# Patient Record
Sex: Male | Born: 1955 | Race: White | Hispanic: No | Marital: Married | Smoking: Never smoker
Health system: Southern US, Community
[De-identification: ages and names within clinical notes are randomized; demographics above are authoritative.]

## PROBLEM LIST (undated history)

## (undated) DIAGNOSIS — E785 Hyperlipidemia, unspecified: Secondary | ICD-10-CM

## (undated) DIAGNOSIS — F419 Anxiety disorder, unspecified: Secondary | ICD-10-CM

## (undated) DIAGNOSIS — F32A Depression, unspecified: Secondary | ICD-10-CM

## (undated) DIAGNOSIS — E119 Type 2 diabetes mellitus without complications: Secondary | ICD-10-CM

## (undated) DIAGNOSIS — H4920 Sixth [abducent] nerve palsy, unspecified eye: Secondary | ICD-10-CM

## (undated) DIAGNOSIS — F410 Panic disorder [episodic paroxysmal anxiety] without agoraphobia: Secondary | ICD-10-CM

## (undated) DIAGNOSIS — K219 Gastro-esophageal reflux disease without esophagitis: Secondary | ICD-10-CM

## (undated) DIAGNOSIS — N200 Calculus of kidney: Secondary | ICD-10-CM

## (undated) DIAGNOSIS — F329 Major depressive disorder, single episode, unspecified: Secondary | ICD-10-CM

## (undated) DIAGNOSIS — J302 Other seasonal allergic rhinitis: Secondary | ICD-10-CM

## (undated) DIAGNOSIS — M4810 Ankylosing hyperostosis [Forestier], site unspecified: Secondary | ICD-10-CM

## (undated) DIAGNOSIS — K5792 Diverticulitis of intestine, part unspecified, without perforation or abscess without bleeding: Secondary | ICD-10-CM

## (undated) HISTORY — DX: Type 2 diabetes mellitus without complications: E11.9

## (undated) HISTORY — DX: Panic disorder (episodic paroxysmal anxiety): F41.0

## (undated) HISTORY — DX: Sixth (abducent) nerve palsy, unspecified eye: H49.20

## (undated) HISTORY — DX: Hyperlipidemia, unspecified: E78.5

## (undated) HISTORY — DX: Anxiety disorder, unspecified: F41.9

## (undated) HISTORY — DX: Major depressive disorder, single episode, unspecified: F32.9

## (undated) HISTORY — DX: Depression, unspecified: F32.A

## (undated) HISTORY — DX: Diverticulitis of intestine, part unspecified, without perforation or abscess without bleeding: K57.92

## (undated) HISTORY — DX: Ankylosing hyperostosis (forestier), site unspecified: M48.10

## (undated) HISTORY — DX: Other seasonal allergic rhinitis: J30.2

## (undated) HISTORY — DX: Gastro-esophageal reflux disease without esophagitis: K21.9

---

## 1999-09-17 ENCOUNTER — Encounter: Payer: Self-pay | Admitting: Internal Medicine

## 1999-09-17 ENCOUNTER — Encounter: Admission: RE | Admit: 1999-09-17 | Discharge: 1999-09-17 | Payer: Self-pay | Admitting: Internal Medicine

## 1999-10-12 ENCOUNTER — Encounter: Admission: RE | Admit: 1999-10-12 | Discharge: 1999-10-12 | Payer: Self-pay | Admitting: Rheumatology

## 1999-10-12 ENCOUNTER — Encounter: Payer: Self-pay | Admitting: Rheumatology

## 1999-12-08 ENCOUNTER — Ambulatory Visit (HOSPITAL_COMMUNITY): Admission: RE | Admit: 1999-12-08 | Discharge: 1999-12-08 | Payer: Self-pay | Admitting: Gastroenterology

## 2001-06-13 ENCOUNTER — Other Ambulatory Visit (HOSPITAL_COMMUNITY): Admission: RE | Admit: 2001-06-13 | Discharge: 2001-06-30 | Payer: Self-pay | Admitting: *Deleted

## 2001-07-24 ENCOUNTER — Encounter: Admission: RE | Admit: 2001-07-24 | Discharge: 2001-07-24 | Payer: Self-pay | Admitting: *Deleted

## 2001-11-22 ENCOUNTER — Encounter: Admission: RE | Admit: 2001-11-22 | Discharge: 2001-11-22 | Payer: Self-pay | Admitting: *Deleted

## 2002-01-24 ENCOUNTER — Encounter: Admission: RE | Admit: 2002-01-24 | Discharge: 2002-01-24 | Payer: Self-pay | Admitting: *Deleted

## 2002-04-04 ENCOUNTER — Encounter: Admission: RE | Admit: 2002-04-04 | Discharge: 2002-04-04 | Payer: Self-pay | Admitting: *Deleted

## 2002-05-02 ENCOUNTER — Encounter: Admission: RE | Admit: 2002-05-02 | Discharge: 2002-05-02 | Payer: Self-pay | Admitting: *Deleted

## 2002-06-13 ENCOUNTER — Encounter: Admission: RE | Admit: 2002-06-13 | Discharge: 2002-06-13 | Payer: Self-pay | Admitting: *Deleted

## 2002-08-13 ENCOUNTER — Encounter: Admission: RE | Admit: 2002-08-13 | Discharge: 2002-08-13 | Payer: Self-pay | Admitting: *Deleted

## 2002-12-10 ENCOUNTER — Encounter: Admission: RE | Admit: 2002-12-10 | Discharge: 2002-12-10 | Payer: Self-pay | Admitting: *Deleted

## 2003-06-27 ENCOUNTER — Encounter: Admission: RE | Admit: 2003-06-27 | Discharge: 2003-06-27 | Payer: Self-pay | Admitting: Psychiatry

## 2003-12-11 ENCOUNTER — Ambulatory Visit (HOSPITAL_COMMUNITY): Payer: Self-pay | Admitting: Professional Counselor

## 2004-01-20 ENCOUNTER — Ambulatory Visit (HOSPITAL_COMMUNITY): Payer: Self-pay | Admitting: Psychiatry

## 2004-02-17 ENCOUNTER — Ambulatory Visit (HOSPITAL_COMMUNITY): Payer: Self-pay | Admitting: Professional Counselor

## 2004-03-01 ENCOUNTER — Emergency Department (HOSPITAL_COMMUNITY): Admission: EM | Admit: 2004-03-01 | Discharge: 2004-03-02 | Payer: Self-pay | Admitting: Emergency Medicine

## 2004-03-10 ENCOUNTER — Ambulatory Visit: Payer: Self-pay | Admitting: Internal Medicine

## 2004-03-31 ENCOUNTER — Ambulatory Visit: Payer: Self-pay | Admitting: Internal Medicine

## 2004-04-20 ENCOUNTER — Ambulatory Visit (HOSPITAL_COMMUNITY): Payer: Self-pay | Admitting: Psychiatry

## 2004-05-27 ENCOUNTER — Ambulatory Visit: Payer: Self-pay | Admitting: Internal Medicine

## 2004-06-02 ENCOUNTER — Ambulatory Visit: Payer: Self-pay | Admitting: Internal Medicine

## 2004-06-15 ENCOUNTER — Ambulatory Visit (HOSPITAL_COMMUNITY): Payer: Self-pay | Admitting: Psychiatry

## 2004-09-21 ENCOUNTER — Ambulatory Visit (HOSPITAL_COMMUNITY): Payer: Self-pay | Admitting: Psychiatry

## 2004-10-12 ENCOUNTER — Ambulatory Visit: Payer: Self-pay | Admitting: Internal Medicine

## 2004-10-19 ENCOUNTER — Ambulatory Visit: Payer: Self-pay | Admitting: Internal Medicine

## 2004-10-19 ENCOUNTER — Encounter: Admission: RE | Admit: 2004-10-19 | Discharge: 2004-10-19 | Payer: Self-pay | Admitting: Internal Medicine

## 2004-11-02 ENCOUNTER — Ambulatory Visit (HOSPITAL_COMMUNITY): Payer: Self-pay | Admitting: Psychiatry

## 2005-02-01 ENCOUNTER — Ambulatory Visit (HOSPITAL_COMMUNITY): Payer: Self-pay | Admitting: Psychiatry

## 2005-02-08 LAB — HM COLONOSCOPY

## 2005-05-03 ENCOUNTER — Ambulatory Visit (HOSPITAL_COMMUNITY): Payer: Self-pay | Admitting: Psychiatry

## 2005-08-11 ENCOUNTER — Ambulatory Visit (HOSPITAL_COMMUNITY): Payer: Self-pay | Admitting: Psychiatry

## 2005-11-08 ENCOUNTER — Ambulatory Visit (HOSPITAL_COMMUNITY): Payer: Self-pay | Admitting: Psychiatry

## 2006-03-07 ENCOUNTER — Ambulatory Visit (HOSPITAL_COMMUNITY): Payer: Self-pay | Admitting: Psychiatry

## 2006-04-18 ENCOUNTER — Emergency Department (HOSPITAL_COMMUNITY): Admission: EM | Admit: 2006-04-18 | Discharge: 2006-04-18 | Payer: Self-pay | Admitting: Emergency Medicine

## 2006-05-17 ENCOUNTER — Ambulatory Visit: Payer: Self-pay

## 2006-05-18 ENCOUNTER — Ambulatory Visit (HOSPITAL_COMMUNITY): Payer: Self-pay | Admitting: Psychiatry

## 2006-08-03 ENCOUNTER — Ambulatory Visit (HOSPITAL_COMMUNITY): Payer: Self-pay | Admitting: Psychiatry

## 2006-11-17 DIAGNOSIS — F329 Major depressive disorder, single episode, unspecified: Secondary | ICD-10-CM

## 2006-11-17 DIAGNOSIS — M109 Gout, unspecified: Secondary | ICD-10-CM

## 2006-11-17 DIAGNOSIS — F411 Generalized anxiety disorder: Secondary | ICD-10-CM | POA: Insufficient documentation

## 2006-12-05 ENCOUNTER — Ambulatory Visit (HOSPITAL_COMMUNITY): Payer: Self-pay | Admitting: Psychiatry

## 2007-03-30 DIAGNOSIS — N200 Calculus of kidney: Secondary | ICD-10-CM

## 2007-03-30 HISTORY — DX: Calculus of kidney: N20.0

## 2007-04-17 ENCOUNTER — Ambulatory Visit (HOSPITAL_COMMUNITY): Payer: Self-pay | Admitting: Psychiatry

## 2007-08-07 ENCOUNTER — Ambulatory Visit (HOSPITAL_COMMUNITY): Payer: Self-pay | Admitting: Psychiatry

## 2007-11-08 ENCOUNTER — Ambulatory Visit: Payer: Self-pay | Admitting: Internal Medicine

## 2007-11-08 LAB — CONVERTED CEMR LAB
ALT: 37 units/L (ref 0–53)
AST: 25 units/L (ref 0–37)
Alkaline Phosphatase: 93 units/L (ref 39–117)
CO2: 32 meq/L (ref 19–32)
Chloride: 101 meq/L (ref 96–112)
Creatinine, Ser: 0.9 mg/dL (ref 0.4–1.5)
GFR calc Af Amer: 114 mL/min
GFR calc non Af Amer: 94 mL/min
HDL: 29.3 mg/dL — ABNORMAL LOW (ref >39.0)
Hemoglobin: 16.2 g/dL (ref 13.0–17.0)
Ketones, ur: NEGATIVE mg/dL
Leukocytes, UA: NEGATIVE
Lymphocytes Relative: 24.1 % (ref 12.0–46.0)
Neutro Abs: 5.3 10*3/uL (ref 1.4–7.7)
Neutrophils Relative %: 66.9 % (ref 43.0–77.0)
Nitrite: NEGATIVE
PSA: 2.12 ng/mL (ref 0.10–4.00)
Potassium: 4.4 meq/L (ref 3.5–5.1)
RBC: 5.89 M/uL — ABNORMAL HIGH (ref 4.22–5.81)
TSH: 1.7 microintl units/mL (ref 0.35–5.50)
Testosterone: 211.08 ng/dL — ABNORMAL LOW (ref 350.00–890)
Total Protein: 7.3 g/dL (ref 6.0–8.3)
Triglycerides: 151 mg/dL — ABNORMAL HIGH (ref 0–149)
Urine Glucose: NEGATIVE mg/dL
Urobilinogen, UA: 0.2 (ref 0.0–1.0)
WBC: 8 10*3/uL (ref 4.5–10.5)

## 2007-11-14 ENCOUNTER — Ambulatory Visit: Payer: Self-pay | Admitting: Internal Medicine

## 2007-11-14 DIAGNOSIS — E785 Hyperlipidemia, unspecified: Secondary | ICD-10-CM | POA: Insufficient documentation

## 2007-11-14 DIAGNOSIS — E291 Testicular hypofunction: Secondary | ICD-10-CM

## 2007-11-14 DIAGNOSIS — M810 Age-related osteoporosis without current pathological fracture: Secondary | ICD-10-CM | POA: Insufficient documentation

## 2007-12-06 ENCOUNTER — Ambulatory Visit (HOSPITAL_COMMUNITY): Payer: Self-pay | Admitting: Psychiatry

## 2008-04-18 ENCOUNTER — Ambulatory Visit: Payer: Self-pay | Admitting: Internal Medicine

## 2008-04-18 LAB — CONVERTED CEMR LAB
Albumin: 4 g/dL (ref 3.5–5.2)
HDL: 30.4 mg/dL — ABNORMAL LOW (ref >39.0)
Total Bilirubin: 1.1 mg/dL (ref 0.3–1.2)
Total CHOL/HDL Ratio: 6.2

## 2008-04-24 ENCOUNTER — Ambulatory Visit: Payer: Self-pay | Admitting: Internal Medicine

## 2008-05-29 ENCOUNTER — Ambulatory Visit (HOSPITAL_COMMUNITY): Payer: Self-pay | Admitting: Psychiatry

## 2008-07-16 ENCOUNTER — Ambulatory Visit: Payer: Self-pay | Admitting: Internal Medicine

## 2008-07-16 LAB — CONVERTED CEMR LAB
AST: 21 units/L (ref 0–37)
Alkaline Phosphatase: 85 units/L (ref 39–117)
Cholesterol: 188 mg/dL (ref 0–200)
Total Bilirubin: 0.9 mg/dL (ref 0.3–1.2)
Total CHOL/HDL Ratio: 9
Total Protein: 7.1 g/dL (ref 6.0–8.3)

## 2008-07-23 ENCOUNTER — Ambulatory Visit: Payer: Self-pay | Admitting: Internal Medicine

## 2008-07-23 LAB — CONVERTED CEMR LAB
Cholesterol, target level: 200 mg/dL
LDL Goal: 130 mg/dL

## 2008-10-22 ENCOUNTER — Ambulatory Visit: Payer: Self-pay | Admitting: Internal Medicine

## 2008-11-04 LAB — CONVERTED CEMR LAB
Testosterone: 174.39 ng/dL — ABNORMAL LOW (ref 350.00–890.00)
Triglycerides: 182 mg/dL — ABNORMAL HIGH (ref 0.0–149.0)

## 2008-11-05 ENCOUNTER — Ambulatory Visit: Payer: Self-pay | Admitting: Internal Medicine

## 2008-11-05 DIAGNOSIS — N529 Male erectile dysfunction, unspecified: Secondary | ICD-10-CM

## 2008-11-25 ENCOUNTER — Ambulatory Visit (HOSPITAL_COMMUNITY): Payer: Self-pay | Admitting: Psychiatry

## 2009-01-29 ENCOUNTER — Encounter: Payer: Self-pay | Admitting: Internal Medicine

## 2009-04-02 ENCOUNTER — Ambulatory Visit: Payer: Self-pay | Admitting: Internal Medicine

## 2009-04-02 DIAGNOSIS — I10 Essential (primary) hypertension: Secondary | ICD-10-CM | POA: Insufficient documentation

## 2009-04-14 LAB — CONVERTED CEMR LAB: Vit D, 25-Hydroxy: 20 ng/mL — ABNORMAL LOW (ref 30–89)

## 2009-05-07 ENCOUNTER — Ambulatory Visit (HOSPITAL_COMMUNITY): Payer: Self-pay | Admitting: Psychiatry

## 2009-06-13 ENCOUNTER — Telehealth: Payer: Self-pay | Admitting: *Deleted

## 2009-06-20 ENCOUNTER — Telehealth: Payer: Self-pay | Admitting: *Deleted

## 2009-10-29 ENCOUNTER — Ambulatory Visit (HOSPITAL_COMMUNITY): Payer: Self-pay | Admitting: Psychiatry

## 2009-11-03 ENCOUNTER — Ambulatory Visit: Payer: Self-pay | Admitting: Internal Medicine

## 2009-11-03 LAB — CONVERTED CEMR LAB
AST: 21 units/L (ref 0–37)
Albumin: 4 g/dL (ref 3.5–5.2)
Alkaline Phosphatase: 84 units/L (ref 39–117)
Basophils Relative: 0.8 % (ref 0.0–3.0)
CO2: 31 meq/L (ref 19–32)
Cholesterol: 216 mg/dL — ABNORMAL HIGH (ref 0–200)
Eosinophils Absolute: 0.2 10*3/uL (ref 0.0–0.7)
HCT: 45.7 % (ref 39.0–52.0)
Lymphocytes Relative: 24.5 % (ref 12.0–46.0)
Lymphs Abs: 2.1 10*3/uL (ref 0.7–4.0)
Monocytes Relative: 5.9 % (ref 3.0–12.0)
Neutro Abs: 5.8 10*3/uL (ref 1.4–7.7)
Neutrophils Relative %: 66.3 % (ref 43.0–77.0)
Platelets: 228 10*3/uL (ref 150.0–400.0)
Potassium: 4.4 meq/L (ref 3.5–5.1)
RBC: 5.68 M/uL (ref 4.22–5.81)
TSH: 2.47 microintl units/mL (ref 0.35–5.50)
Total Protein: 6.7 g/dL (ref 6.0–8.3)
VLDL: 34.8 mg/dL (ref 0.0–40.0)

## 2009-11-05 ENCOUNTER — Ambulatory Visit: Payer: Self-pay | Admitting: Internal Medicine

## 2009-11-05 DIAGNOSIS — E119 Type 2 diabetes mellitus without complications: Secondary | ICD-10-CM

## 2009-11-05 DIAGNOSIS — M4810 Ankylosing hyperostosis [Forestier], site unspecified: Secondary | ICD-10-CM

## 2009-11-06 LAB — CONVERTED CEMR LAB
BUN: 16 mg/dL (ref 6–23)
CO2: 30 meq/L (ref 19–32)
Calcium: 9.1 mg/dL (ref 8.4–10.5)
Creatinine, Ser: 0.8 mg/dL (ref 0.4–1.5)
GFR calc non Af Amer: 106.91 mL/min (ref >60)
Potassium: 4.5 meq/L (ref 3.5–5.1)
Sodium: 139 meq/L (ref 135–145)

## 2009-11-14 ENCOUNTER — Encounter: Admission: RE | Admit: 2009-11-14 | Discharge: 2009-11-14 | Payer: Self-pay | Admitting: Neurosurgery

## 2009-12-10 ENCOUNTER — Ambulatory Visit (HOSPITAL_COMMUNITY): Payer: Self-pay | Admitting: Psychiatry

## 2009-12-18 ENCOUNTER — Encounter: Payer: Self-pay | Admitting: Internal Medicine

## 2010-02-04 ENCOUNTER — Ambulatory Visit: Payer: Self-pay | Admitting: Internal Medicine

## 2010-02-04 LAB — CONVERTED CEMR LAB
ALT: 25 units/L (ref 0–53)
AST: 23 units/L (ref 0–37)
Chloride: 102 meq/L (ref 96–112)
Creatinine, Ser: 0.9 mg/dL (ref 0.4–1.5)
GFR calc non Af Amer: 96.96 mL/min (ref >60)
Hgb A1c MFr Bld: 6.3 % (ref 4.6–6.5)
Total Protein: 6.7 g/dL (ref 6.0–8.3)

## 2010-02-10 ENCOUNTER — Ambulatory Visit: Payer: Self-pay | Admitting: Internal Medicine

## 2010-03-05 ENCOUNTER — Ambulatory Visit: Payer: Self-pay | Admitting: Internal Medicine

## 2010-03-13 ENCOUNTER — Ambulatory Visit: Payer: Self-pay | Admitting: Internal Medicine

## 2010-04-22 ENCOUNTER — Ambulatory Visit
Admission: RE | Admit: 2010-04-22 | Discharge: 2010-04-22 | Payer: Self-pay | Source: Home / Self Care | Attending: Internal Medicine | Admitting: Internal Medicine

## 2010-04-22 DIAGNOSIS — M542 Cervicalgia: Secondary | ICD-10-CM | POA: Insufficient documentation

## 2010-04-30 NOTE — Progress Notes (Signed)
----   Converted from flag ---- ---- 06/12/2009 7:03 PM, Layne Benton, RN, BSN wrote: I forgot that you and JJ are off on Thursday in the pm. We will be at home on 3/18 in the am... [phone]. And I will be back at work on 3/21. Thanks....Carlyon Shadow ------------------------------  called and left message on message- per dr Lovell Sheehan hold benicar for 1 week and take nothing- monitor rash and bp for 1 week and let us know

## 2010-04-30 NOTE — Assessment & Plan Note (Signed)
Summary: 2 month rov/njr/pts spouse rsc/cjr   Vital Signs:  Patient profile:   55 year old male Height:      72 inches Weight:      268 pounds BMI:     36.48 Temp:     98.2 degrees F oral Pulse rate:   84 / minute Resp:     14 per minute BP sitting:   140 / 84  (left arm) Cuff size:   large  Vitals Entered By: Willy Eddy, LPN (February 10, 2010 3:48 PM) CC: roa labs-didnt take kobiglyze wife states they brought down blood sugar, Hypertension Management Is Patient Diabetic? No   Primary Care Provider:  Stacie Glaze MD  CC:  roa labs-didnt take kobiglyze wife states they brought down blood sugar and Hypertension Management.  History of Present Illness: The pt has not been on the kombigylize... due to the fear that he would not be able to get off the medications HE has been able to loose over 20 pounds His CBG's remain high He hs DISH symdrome and has not been able to have cotrol of pain. This has significanly interfered with his ability to work   Hypertension History:      He denies headache, chest pain, palpitations, dyspnea with exertion, orthopnea, PND, peripheral edema, visual symptoms, neurologic problems, syncope, and side effects from treatment.        Positive major cardiovascular risk factors include male age 57 years old or older, diabetes, hyperlipidemia, and hypertension.  Negative major cardiovascular risk factors include non-tobacco-user status.        Further assessment for target organ damage reveals no history of ASHD, stroke/TIA, or peripheral vascular disease.     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) 6)  Hyperlipidemia  (ICD-272.4) 7)  Osteoporosis  (ICD-733.00) 8)  Physical Examination, Normal  (ICD-V70.0) 9)  Gout  (ICD-274.9) 10)  Depression  (ICD-311) 11)  Anxiety  (ICD-300.00)  Current Problems (verified): 1)  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 One By Mouth Daily 2)  Allerex .Marland Kitchen.. 1 Two Times A Day 3)  Flonase 50 Mcg/act  Susp (Fluticasone Propionate) .... Once Daily 4)  Celebrex 200 Mg  Caps (Celecoxib) .Marland Kitchen.. 1 Two Times A Day 5)  Cymbalta 60 Mg  Cpep (Duloxetine Hcl) .Marland Kitchen.. 1 Once Daily 6)  Colchicine 0.6 Mg  Tabs (Colchicine) .Marland Kitchen.. 1 Once Daily  As Needed 7)  Flexeril 10 Mg  Tabs (Cyclobenzaprine Hcl) .Marland Kitchen.. 1 Three Times A Day As Needed 8)  Tramadol Hcl 50 Mg Tabs (Tramadol Hcl) .Marland Kitchen.. 1 W/ Tylneol 325 Every 4-6 Hours As Needed Pain 9)  Ativan 1 Mg  Tabs (Lorazepam) .... As Needed 10)  Doxycycline Hyclate 100 Mg  Cpep (Doxycycline Hyclate) .... As Needed 11)  Astelin 137 Mcg/spray  Soln (Azelastine Hcl) .... As Needed 12)  Allergy Injections 13)  Krill Oil 1000 Mg Caps (Krill Oil) .... 2 Once Daily 14)  Diclofenac Sodium 0.1 % Soln (Diclofenac Sodium) .... Apply As Directed 15)  D3-50 50000 Unit Caps (Cholecalciferol) .... One By Mouth Twice A Week 16)  Cialis 20 Mg Tabs (Tadalafil) .... Take As Directed 17)  Melatonin 3 Mg Tabs (Melatonin) .Marland Kitchen.. 1 At Bedtime As Needed 18)  Mentax 1 % Crea (Butenafine Hcl) .... Use As Directed 19)  Atacand 16 Mg Tabs (Candesartan Cilexetil) .Marland Kitchen..  1 Once Daily 20)  Valium 5 Mg Tabs (Diazepam) .Marland Kitchen.. 1 At Bedtime As Needed Sleep 21)  Kombiglyze Xr 2.07-998 Mg Xr24h-Tab (Saxagliptin-Metformin) .... One By Mouth Daily 22)  Nucynta 100 Mg Tabs (Tapentadol Hcl) .... One By Mouth Daily  Current Medications (verified): 1)  Nexium 40 Mg  Cpdr (Esomeprazole Magnesium) .... One By Mouth Daily 2)  Allerex .Marland Kitchen.. 1 Two Times A Day 3)  Flonase 50 Mcg/act  Susp (Fluticasone Propionate) .... Once Daily 4)  Celebrex 200 Mg  Caps (Celecoxib) .Marland Kitchen.. 1 Two Times A Day 5)  Cymbalta 60 Mg  Cpep (Duloxetine Hcl) .Marland Kitchen.. 1 Once Daily 6)  Colchicine 0.6 Mg  Tabs (Colchicine) .Marland Kitchen.. 1 Once Daily  As Needed 7)  Flexeril 10 Mg  Tabs (Cyclobenzaprine Hcl) .Marland Kitchen.. 1 Three Times A Day As Needed 8)  Ativan 1 Mg  Tabs (Lorazepam) .... As Needed 9)  Doxycycline Hyclate 100 Mg  Cpep (Doxycycline Hyclate) .... As Needed 10)  Astelin 137 Mcg/spray  Soln (Azelastine Hcl) .... As Needed 11)  Allergy Injections 12)  Krill Oil 1000 Mg Caps (Krill Oil) .... 2 Once Daily 13)  Diclofenac Sodium 0.1 % Soln (Diclofenac Sodium) .... Apply As Directed 14)  D3-50 50000 Unit Caps (Cholecalciferol) .... One By Mouth Twice A Week 15)  Cialis 20 Mg Tabs (Tadalafil) .... Take As Directed 16)  Melatonin 3 Mg Tabs (Melatonin) .Marland Kitchen.. 1 At Bedtime As Needed 17)  Mentax 1 % Crea (Butenafine Hcl) .... Use As Directed 18)  Atacand 16 Mg Tabs (Candesartan Cilexetil) .Marland Kitchen.. 1 Once Daily 19)  Valium 5 Mg Tabs (Diazepam) .Marland Kitchen.. 1 At Bedtime As Needed Sleep 20)  Nucynta 100 Mg Tabs (Tapentadol Hcl) .... One By Mouth Daily  Allergies (verified): No Known Drug Allergies  Past History:  Family History: Last updated: 04/02/2009 Family History Hypertension MOTHER AND GRANDMOTHER AND SISTER ( MOTHER IN 67's)  Social History: Last updated: 11/14/2007 Married Never Smoked  Risk Factors: Smoking Status: never (02/10/2010)  Past medical, surgical, family and social histories (including risk factors)  reviewed, and no changes noted (except as noted below).  Past Medical History: Reviewed history from 11/14/2007 and no changes required. Anxiety Depression Gout DISH Severe seasonal allergies Hyperlipidemia  Past Surgical History: Reviewed history from 11/17/2006 and no changes required. Colonoscopy-02/08/2005  Family History: Reviewed history from 04/02/2009 and no changes required. Family History Hypertension MOTHER AND GRANDMOTHER AND SISTER ( MOTHER IN 32's)  Social History: Reviewed history from 11/14/2007 and no changes required. Married Never Smoked  Review of Systems       Flu Vaccine Consent Questions     Do you have a history of severe allergic reactions to this vaccine? no    Any prior history of allergic reactions to egg and/or gelatin? no    Do you have a sensitivity to the preservative  Thimersol? no    Do you have a past history of Guillan-Barre Syndrome? no    Do you currently have an acute febrile illness? no    Have you ever had a severe reaction to latex? no    Vaccine information given and explained to patient? yes    Are you currently pregnant? no    Lot Number:[id number]   Exp Date:09/26/2010   Site Given  Left Deltoid IM   Physical Exam  General:  alert and overweight-appearing.   Head:  normocephalic and male-pattern balding.   Eyes:  pupils equal and pupils round.   Ears:  R ear normal and L ear normal.   Nose:  no external deformity and no nasal discharge.  no external deformity.   Mouth:  good dentition and pharynx pink and moist.   Neck:  No deformities, masses, or tenderness noted. Lungs:  Normal respiratory effort, chest expands symmetrically. Lungs are clear to auscultation, no crackles or wheezes. Heart:  Normal rate and regular rhythm. S1 and S2 normal without gallop, murmur, click, rub or other extra sounds. Abdomen:  soft and non-tender.  obese Msk:  lumbar lordosis, SI joint tenderness, and trigger point tenderness.      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 One by mouth daily His updated medication list for this problem includes:    Atacand 16 Mg Tabs (Candesartan cilexetil) .Marland Kitchen... 1 once daily  Labs Reviewed: Creat: 0.9 (02/04/2010)    Reviewed HgBA1c results: 6.3 (02/04/2010)  7.2 (11/05/2009)  Problem # 2:  HYPERTENSION, MILD (ICD-401.1)  His updated medication list for this problem includes:    Atacand 16 Mg Tabs (Candesartan cilexetil) .Marland Kitchen... 1 once daily  BP today: 140/84 Prior BP: 134/80 (11/05/2009)  Prior 10 Yr Risk Heart Disease: 22 % (11/05/2009)  Labs Reviewed: K+: 4.2 (02/04/2010) Creat: : 0.9 (02/04/2010)   Chol: 216 (11/03/2009)   HDL: 33.50 (11/03/2009)   LDL: 121 (10/22/2008)   TG: 174.0 (11/03/2009)  Problem # 3:  DIFFUSE IDIOPATHIC SKELETAL HYPEROSTOSIS (ICD-733.99) stable, did not have success with the injections Discussed medication use, applications of heat or ice, and exercises.  the current pain meds ( nucenta) works but has the sedation side effecs increased HA and localized pain in neck still has difficulty with work dueconcentration due to the pain , short term memory loss and fatigue seeing PT twice a month for over 10 years  Complete Medication List: 1)  Nexium 40 Mg Cpdr (Esomeprazole magnesium) .... One by mouth daily 2)  Allerex  .Marland KitchenMarland KitchenMarland Kitchen 1 two times a day 3)  Flonase 50 Mcg/act Susp (Fluticasone propionate) .... Once daily 4)  Celebrex 200 Mg Caps (Celecoxib) .Marland Kitchen.. 1 two times a day 5)  Cymbalta 60 Mg Cpep (Duloxetine hcl) .Marland Kitchen.. 1 once daily 6)  Colchicine 0.6 Mg Tabs (Colchicine) .Marland Kitchen.. 1 once daily  as needed 7)  Flexeril 10 Mg Tabs (Cyclobenzaprine hcl) .Marland Kitchen.. 1 three times a day as needed 8)  Ativan 1 Mg Tabs (Lorazepam) .... As needed 9)   Doxycycline Hyclate 100 Mg Cpep (Doxycycline hyclate) .... As needed 10)  Astelin 137 Mcg/spray Soln (Azelastine hcl) .... As needed 11)  Allergy Injections  12)  Krill Oil 1000 Mg Caps (Krill oil) .... 2 once daily 13)  Diclofenac Sodium 0.1 % Soln (Diclofenac sodium) .... Apply as directed  14)  D3-50 50000 Unit Caps (Cholecalciferol) .... One by mouth twice a week 15)  Cialis 20 Mg Tabs (Tadalafil) .... Take as directed 16)  Melatonin 3 Mg Tabs (Melatonin) .Marland Kitchen.. 1 at bedtime as needed 17)  Mentax 1 % Crea (Butenafine hcl) .... Use as directed 18)  Atacand 16 Mg Tabs (Candesartan cilexetil) .Marland Kitchen.. 1 once daily 19)  Valium 5 Mg Tabs (Diazepam) .Marland Kitchen.. 1 at bedtime as needed sleep 20)  Nucynta 100 Mg Tabs (Tapentadol hcl) .... One by mouth daily  Other Orders: Admin 1st Vaccine (16109) Flu Vaccine 63yrs + [phone])  Hypertension Assessment/Plan:      The patient's hypertensive risk group is category C: Target organ damage and/or diabetes.  His calculated 10 year risk of coronary heart disease is 27 %.  Today's blood pressure is 140/84.  His blood pressure goal is < 140/90.  Patient Instructions: 1)  HbgA1C prior to visit, ICD-9:250.00 2)  Please schedule a follow-up appointment in 1 month. Prescriptions: NUCYNTA 100 MG TABS (TAPENTADOL HCL) one by mouth daily  #30 x 0   Entered and Authorized by:   Stacie Glaze MD   Signed by:   Stacie Glaze MD on 02/10/2010   Method used:   Print then Give to Patient   RxID:   [id number] NUCYNTA 100 MG TABS (TAPENTADOL HCL) one by mouth daily  #30 x 0   Entered and Authorized by:   Stacie Glaze MD   Signed by:   Stacie Glaze MD on 02/10/2010   Method used:   Print then Give to Patient   RxID:   [id number] NUCYNTA 100 MG TABS (TAPENTADOL HCL) one by mouth daily  #30 x 0   Entered and Authorized by:   Stacie Glaze MD   Signed by:   Stacie Glaze MD on 02/10/2010   Method used:   Print then Give to Patient   RxID:    [id number]    Orders Added: 1)  Admin 1st Vaccine [90471] 2)  Flu Vaccine 20yrs + [44034] 3)  Est. Patient Level IV [74259]

## 2010-04-30 NOTE — Assessment & Plan Note (Signed)
Summary: 1 month f/u//alp   Vital Signs:  Patient profile:   55 year old male Height:      72 inches Weight:      272 pounds BMI:     37.02 Temp:     98.2 degrees F oral Pulse rate:   80 / minute Resp:     14 per minute BP sitting:   140 / 80  (left arm) Cuff size:   large  Vitals Entered By: Willy Eddy, LPN (March 13, 2010 11:53 AM) CC: roa labs, Hypertension Management Is Patient Diabetic? Yes Did you bring your meter with you today? No   Primary Care Pancho Rushing:  Stacie Glaze MD  CC:  roa labs and Hypertension Management.  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 tasks take 2-3 times longer to accomplish and are error filled The pain interferes with his ability to concentrate and  results in increaseding work error levels.  Hypertension History:      He denies headache, chest pain, palpitations, dyspnea with exertion, orthopnea, PND, peripheral edema, visual symptoms, neurologic problems, syncope, and side effects from treatment.        Positive major cardiovascular risk factors include male age 63 years old or older, diabetes, hyperlipidemia, and hypertension.  Negative major cardiovascular risk factors include non-tobacco-user status.        Further assessment for target organ damage reveals no history of ASHD, stroke/TIA, or peripheral vascular disease.     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) 6)  Hyperlipidemia  (ICD-272.4) 7)  Osteoporosis  (ICD-733.00) 8)  Physical Examination, Normal  (ICD-V70.0) 9)  Gout  (ICD-274.9) 10)  Depression  (ICD-311) 11)  Anxiety  (ICD-300.00)  Current Problems (verified): 1)  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 One By Mouth Daily 2)  Allerex .Marland Kitchen.. 1 Two Times A Day 3)  Flonase 50 Mcg/act  Susp (Fluticasone Propionate) .... Once Daily 4)  Celebrex 200 Mg  Caps (Celecoxib) .Marland Kitchen.. 1 Two Times A Day 5)  Cymbalta 60 Mg  Cpep (Duloxetine Hcl) .Marland Kitchen.. 1 Once Daily 6)  Colchicine 0.6 Mg  Tabs (Colchicine) .Marland Kitchen.. 1 Once Daily  As Needed 7)  Flexeril 10 Mg  Tabs (Cyclobenzaprine Hcl) .Marland Kitchen.. 1 Three Times A Day As Needed 8)  Ativan 1 Mg  Tabs (Lorazepam) .... As Needed 9)  Doxycycline Hyclate 100 Mg  Cpep (Doxycycline Hyclate) .... As Needed 10)  Astelin 137  Mcg/spray  Soln (Azelastine Hcl) .... As Needed 11)  Allergy Injections 12)  Krill Oil 1000 Mg Caps (Krill Oil) .... 2 Once Daily 13)  Diclofenac Sodium 0.1 % Soln (Diclofenac Sodium) .... Apply As Directed 14)  D3-50 50000 Unit Caps (Cholecalciferol) .... One By Mouth Twice A Week 15)  Cialis 20 Mg Tabs (Tadalafil) .... Take As Directed 16)  Melatonin 3 Mg Tabs (Melatonin) .Marland Kitchen.. 1 At Bedtime As Needed 17)  Mentax 1 % Crea (Butenafine Hcl) .... Use As Directed 18)  Atacand 16 Mg Tabs (Candesartan Cilexetil) .Marland Kitchen.. 1 Once Daily 19)  Valium 5 Mg  Tabs (Diazepam) .Marland Kitchen.. 1 At Bedtime As Needed Sleep 20)  Nucynta 100 Mg Tabs (Tapentadol Hcl) .... One By Mouth Daily  Current Medications (verified): 1)  Nexium 40 Mg  Cpdr (Esomeprazole Magnesium) .... One By Mouth Daily 2)  Allerex .Marland Kitchen.. 1 Two Times A Day 3)  Flonase 50 Mcg/act  Susp (Fluticasone Propionate) .... Once Daily 4)  Celebrex 200 Mg  Caps (Celecoxib) .Marland Kitchen.. 1 Two Times A Day 5)  Cymbalta 60 Mg  Cpep (Duloxetine Hcl) .Marland Kitchen.. 1 Once Daily 6)  Colchicine 0.6 Mg  Tabs (Colchicine) .Marland Kitchen.. 1 Once Daily  As Needed 7)  Flexeril 10 Mg  Tabs (Cyclobenzaprine Hcl) .Marland Kitchen.. 1 Three Times A Day As Needed 8)  Ativan 1 Mg  Tabs (Lorazepam) .... As Needed 9)  Doxycycline Hyclate 100 Mg  Cpep (Doxycycline Hyclate) .... As Needed 10)  Astelin 137 Mcg/spray  Soln (Azelastine Hcl) .... As Needed 11)  Allergy Injections 12)  Krill Oil 1000 Mg Caps (Krill Oil) .... 2 Once Daily 13)  Diclofenac Sodium 0.1 % Soln (Diclofenac Sodium) .... Apply As Directed 14)  D3-50 50000 Unit Caps (Cholecalciferol) .... One By Mouth Twice A Week 15)  Cialis 20 Mg Tabs (Tadalafil) .... Take As Directed 16)  Melatonin 3 Mg Tabs (Melatonin) .Marland Kitchen.. 1 At Bedtime As Needed 17)  Mentax 1 % Crea (Butenafine Hcl) .... Use As Directed 18)  Atacand 16 Mg Tabs (Candesartan Cilexetil) .Marland Kitchen.. 1 Once Daily 19)  Valium 5 Mg Tabs (Diazepam) .Marland Kitchen.. 1 At Bedtime As Needed Sleep 20)  Nucynta 100 Mg Tabs (Tapentadol Hcl) .... One By Mouth Daily  Allergies (verified): No Known Drug Allergies  Past History:  Family History: Last updated: 04/02/2009 Family History Hypertension MOTHER AND GRANDMOTHER AND SISTER ( MOTHER IN 11's)  Social History: Last updated: 11/14/2007 Married Never Smoked  Risk Factors: Smoking Status: never (03/13/2010)  Past medical, surgical, family and social histories (including risk factors) reviewed, and no changes noted (except as noted below).  Past Medical History: Reviewed history from 11/14/2007 and no changes  required. Anxiety Depression Gout DISH Severe seasonal allergies Hyperlipidemia  Past Surgical History: Reviewed history from 11/17/2006 and no changes required. Colonoscopy-02/08/2005  Family History: Reviewed history from 04/02/2009 and no changes required. Family History Hypertension MOTHER AND GRANDMOTHER AND SISTER ( MOTHER IN 37's)  Social History: Reviewed history from 11/14/2007 and no changes required. Married Never Smoked  Review of Systems       The patient complains of headaches and difficulty walking.  The patient denies anorexia, fever, weight loss, weight gain, vision loss, decreased hearing, hoarseness, chest pain, syncope, dyspnea on exertion, peripheral edema, prolonged cough, hemoptysis, abdominal pain, melena, hematochezia, severe indigestion/heartburn, hematuria, incontinence, genital sores, muscle weakness, suspicious skin lesions, transient blindness, depression, unusual weight change, abnormal bleeding, enlarged lymph nodes, angioedema, breast masses, and testicular masses.  neck pain  Physical Exam  General:  alert and overweight-appearing.   Head:  normocephalic and male-pattern balding.   Eyes:  pupils equal and pupils round.   Ears:  R ear normal and L ear normal.   Nose:  no external deformity and no nasal discharge.  no external deformity.   Neck:  nuchal rigidity and decreased ROM.   Lungs:  Normal respiratory effort, chest expands symmetrically. Lungs are clear to auscultation, no crackles or wheezes. Heart:  Normal rate and regular rhythm. S1 and S2 normal without gallop, murmur, click, rub or other extra sounds. Abdomen:  soft and non-tender.  obese Msk:  lumbar lordosis, SI joint tenderness, and trigger point tenderness.     Impression & Recommendations:  Problem # 1:  DIFFUSE IDIOPATHIC SKELETAL HYPEROSTOSIS (ICD-733.99) Assessment Deteriorated persistant pain that interfers with job  Problem # 2:  DIAB W/OTH MANIFESTS TYPE  II/UNS TYPE UNCNTRL (ICD-250.82) Assessment: Unchanged weight lass needed diet is poor and needs reinforcement'wife is RN His updated medication list for this problem includes:    Atacand 16 Mg Tabs (Candesartan cilexetil) .Marland Kitchen... 1 once daily  Problem # 3:  HYPOGONADISM, MALE (ICD-257.2) on replacement  Problem # 4:  DEPRESSION (ICD-311) Assessment: Deteriorated  His updated medication list for this problem includes:    Cymbalta 60 Mg Cpep (Duloxetine hcl) .Marland Kitchen... 1 once daily    Ativan 1 Mg Tabs (Lorazepam) .Marland Kitchen... As needed    Valium 5 Mg Tabs (Diazepam) .Marland Kitchen... 1 at bedtime as needed sleep  Discussed treatment options, including trial of antidpressant medication. Will refer to behavioral health. Follow-up call in in 24-48 hours and recheck in 2 weeks, sooner as needed. Patient agrees to call if any worsening of symptoms or thoughts of doing harm arise. Verified that the patient has no suicidal ideation at this time.   Complete Medication List: 1)  Nexium 40 Mg Cpdr (Esomeprazole magnesium) .... One by mouth daily 2)  Allerex  .Marland KitchenMarland KitchenMarland Kitchen 1 two times a day 3)  Flonase 50 Mcg/act Susp (Fluticasone propionate) .... Once daily 4)  Celebrex 200 Mg Caps (Celecoxib) .Marland Kitchen.. 1 two times a day 5)  Cymbalta 60 Mg Cpep (Duloxetine hcl) .Marland Kitchen.. 1 once daily 6)  Colchicine 0.6 Mg Tabs (Colchicine) .Marland Kitchen.. 1 once daily  as needed 7)  Flexeril 10 Mg Tabs (Cyclobenzaprine hcl) .Marland Kitchen.. 1 three times a day as needed 8)  Ativan 1 Mg Tabs (Lorazepam) .... As needed 9)  Doxycycline Hyclate 100 Mg Cpep (Doxycycline hyclate) .... As needed 10)  Astelin 137 Mcg/spray Soln (Azelastine hcl) .... As needed 11)  Allergy Injections  12)  Krill Oil 1000 Mg Caps (Krill oil) .... 2 once daily 13)  Diclofenac Sodium 0.1 % Soln (Diclofenac sodium) .... Apply as directed 14)  D3-50 50000 Unit Caps (Cholecalciferol) .... One by mouth twice a week 15)  Cialis 20 Mg Tabs (Tadalafil) .... Take as directed 16)  Melatonin 3 Mg Tabs (Melatonin)  .Marland Kitchen.. 1 at bedtime as needed 17)  Mentax 1 % Crea (Butenafine hcl) .... Use as directed 18)  Atacand 16 Mg Tabs (Candesartan cilexetil) .Marland Kitchen.. 1 once daily 19)  Valium 5 Mg Tabs (Diazepam) .Marland Kitchen.. 1 at bedtime as needed sleep 20)  Nucynta 100 Mg Tabs (Tapentadol hcl) .... One by mouth daily  Hypertension Assessment/Plan:      The patient's hypertensive risk group is category C: Target organ damage and/or diabetes.  His calculated 10 year risk of coronary heart disease is 27 %.  Today's blood pressure  is 140/80.  His blood pressure goal is < 140/90.  Patient Instructions: 1)  Please schedule a follow-up appointment in 1 month.   Orders Added: 1)  Est. Patient Level III [16109]

## 2010-04-30 NOTE — Progress Notes (Signed)
----   Converted from flag ---- ---- 06/13/2009 8:12 AM, Willy Eddy, LPN wrote: per dr Lovell Sheehan- stop the benicar and monitor bp and rash for 1 week(take nothing)and let us know the bp readings and how rash is in 1 week. ---- 06/12/2009 1:36 PM, Layne Benton, RN, BSN wrote: Cindie Crumbly.Marland KitchenMarland KitchenCan you discuss this problem with Dr.Jenkins?Marland KitchenMarland KitchenMarland KitchenRenn started taking Benicar 20mg  every day about 6 to 8 weeks ago and now has an itchy rash on abdomen and legs for the past 1 week and getting worse each day. I am concerned that he is having a reaction to the medication...he held the dose this am. Can we let him wash out and try something else? P.S. the drug is made in [place] and [place] doesn't want to take it long term because of possible radiation exposure!! Can Dr.Jenkins order another medication? We have samples of several ARB's here in cardiology and I have Cozaar at home for myself that he could borrow on a trial basis. Please let me know. If you need to speak with me I am at [phone] ( I will be tied up from 2 till 315 interviewing a nurse). Thanks.Marland KitchenMarland KitchenCarlyon Shadow ------------------------------  Appended Document:  left message on machine at home- per dr Lovell Sheehan- hold benicar and take nothing and monitor bp and rash for 1 week and let dr Lovell Sheehan know readings  Appended Document:  per dr Lovell Sheehan- change to atacand 16 mg 1 once daily

## 2010-04-30 NOTE — Assessment & Plan Note (Signed)
Summary: PAIN AND FATIGUE/NJR   Vital Signs:  Patient profile:   55 year old male Height:      72 inches Weight:      282 pounds BMI:     38.38 Temp:     98.2 degrees F oral Pulse rate:   80 / minute Resp:     14 per minute BP sitting:   134 / 80  (left arm) Cuff size:   large  Vitals Entered By: Willy Eddy, LPN (November 05, 2009 2:07 PM) CC: roa labs, Hypertension Management Is Patient Diabetic? No   Primary Care Provider:  Stacie Glaze MD  CC:  roa labs and Hypertension Management.  History of Present Illness: the pt has crosses into AODM the pt has many sugar containing foods in the home the pt and his wife admit to poor diet there is a family hx of DM he is low on energy  he has noted increased pain in his neck the pain is so severe that it impares work he has the diagnosis of DISH and has been evaluated at [facility] he has detected some difficulty swallowing  Hypertension History:      He denies headache, chest pain, palpitations, dyspnea with exertion, orthopnea, PND, peripheral edema, visual symptoms, neurologic problems, syncope, and side effects from treatment.        Positive major cardiovascular risk factors include male age 73 years old or older, diabetes, hyperlipidemia, and hypertension.  Negative major cardiovascular risk factors include non-tobacco-user status.        Further assessment for target organ damage reveals no history of ASHD, stroke/TIA, or peripheral vascular disease.     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 One By Mouth Daily 2)  Allerex .Marland Kitchen.. 1 Two Times A Day 3)  Flonase 50 Mcg/act  Susp (Fluticasone Propionate) .... Once Daily 4)  Celebrex 200 Mg  Caps (Celecoxib) .Marland Kitchen.. 1 Two Times A Day 5)  Cymbalta 60 Mg  Cpep (Duloxetine Hcl) .Marland Kitchen.. 1 Once Daily 6)  Colchicine 0.6 Mg  Tabs (Colchicine) .Marland Kitchen.. 1 Once Daily  As Needed 7)  Flexeril 10 Mg  Tabs (Cyclobenzaprine Hcl) .Marland Kitchen.. 1 Three Times A Day As Needed 8)  Tramadol Hcl 50 Mg Tabs (Tramadol Hcl) .Marland Kitchen.. 1 W/ Tylneol 325 Every 4-6 Hours As Needed Pain 9)  Ativan 1 Mg  Tabs (Lorazepam) .... As Needed 10)  Doxycycline Hyclate 100 Mg  Cpep (Doxycycline Hyclate) .... As Needed 11)  Astelin 137 Mcg/spray  Soln (Azelastine Hcl) .... As Needed 12)  Allergy Injections 13)  Krill Oil 1000 Mg Caps (Krill Oil) .Marland Kitchen.. 1 Once Daily 14)  Diclofenac Sodium 0.1 %  Soln (Diclofenac Sodium) .... Apply As Directed 15)  Cialis 20 Mg Tabs (Tadalafil) .... As Directed 16)  Ergocalciferol 50000 Unit Caps (Ergocalciferol) .Marland Kitchen.. 1 Twice A Week 17)  Cialis 20 Mg Tabs (Tadalafil) .... Take As Directed 18)  Melatonin 3 Mg Tabs (Melatonin) .Marland Kitchen.. 1 At Bedtime As Needed 19)  Mentax 1 % Crea (Butenafine Hcl) .... Use As Directed 20)  Atacand 16 Mg Tabs (Candesartan Cilexetil) .Marland Kitchen.. 1 Once Daily  Current Medications (verified): 1)  Nexium 40 Mg  Cpdr (Esomeprazole Magnesium) .... One By Mouth Daily 2)  Allerex .Marland Kitchen.. 1 Two Times A Day 3)  Flonase 50 Mcg/act  Susp (Fluticasone Propionate) .... Once Daily 4)  Celebrex 200 Mg  Caps (Celecoxib) .Marland Kitchen.. 1 Two Times A  Day 5)  Cymbalta 60 Mg  Cpep (Duloxetine Hcl) .Marland Kitchen.. 1 Once Daily 6)  Colchicine 0.6 Mg  Tabs (Colchicine) .Marland Kitchen.. 1 Once Daily  As Needed 7)  Flexeril 10 Mg  Tabs (Cyclobenzaprine Hcl) .Marland Kitchen.. 1 Three Times A Day As Needed 8)  Tramadol Hcl 50 Mg Tabs (Tramadol Hcl) .Marland Kitchen.. 1 W/ Tylneol 325 Every 4-6 Hours As Needed Pain 9)  Ativan 1 Mg  Tabs (Lorazepam) .... As Needed 10)  Doxycycline Hyclate 100 Mg  Cpep (Doxycycline Hyclate) .... As Needed 11)  Astelin 137 Mcg/spray  Soln (Azelastine Hcl) .... As Needed 12)  Allergy Injections 13)  Krill Oil 1000 Mg Caps (Krill Oil) .... 2 Once Daily 14)  Diclofenac Sodium 0.1 % Soln (Diclofenac Sodium) .... Apply As Directed 15)  D3-50 50000 Unit Caps (Cholecalciferol) .... One By Mouth Twice A Week 16)  Cialis 20 Mg Tabs (Tadalafil) .... Take As Directed 17)  Melatonin 3 Mg Tabs (Melatonin) .Marland Kitchen.. 1 At Bedtime As Needed 18)  Mentax 1 % Crea (Butenafine Hcl) .... Use As Directed 19)  Atacand 16 Mg Tabs (Candesartan Cilexetil) .Marland Kitchen.. 1 Once Daily 20)  Valium 5 Mg Tabs (Diazepam) .Marland Kitchen.. 1 At Bedtime As Needed Sleep 21)  Kombiglyze Xr 2.07-998 Mg Xr24h-Tab (Saxagliptin-Metformin) .... One By Mouth Daily  Allergies (verified): No Known Drug Allergies  Past History:  Family History: Last updated: 04/02/2009 Family History Hypertension MOTHER AND GRANDMOTHER AND SISTER ( MOTHER IN 43's)  Social History: Last updated: 11/14/2007 Married Never Smoked  Risk Factors: Smoking Status: never (11/05/2009)  Past medical, surgical, family and social histories (including risk factors) reviewed, and no changes noted (except as noted below).  Past Medical History: Reviewed history from 11/14/2007 and no changes required. Anxiety Depression Gout DISH Severe seasonal allergies Hyperlipidemia  Past Surgical History: Reviewed history from 11/17/2006 and no changes required. Colonoscopy-02/08/2005  Family History: Reviewed history from 04/02/2009 and no changes  required. Family History Hypertension MOTHER AND GRANDMOTHER AND SISTER ( MOTHER IN 36's)  Social History: Reviewed history from 11/14/2007 and no changes required. Married Never Smoked  Review of Systems  The patient denies anorexia, fever, weight loss, weight gain, vision loss, decreased hearing, hoarseness, chest pain, syncope, dyspnea on exertion, peripheral edema, prolonged cough, headaches, hemoptysis, abdominal pain, melena, hematochezia, severe indigestion/heartburn, hematuria, incontinence, genital sores, muscle weakness, suspicious skin lesions, transient blindness, difficulty walking, depression, unusual weight change, abnormal bleeding, enlarged lymph nodes, angioedema, breast masses, and testicular masses.    Physical Exam  General:  alert and overweight-appearing.   Head:  normocephalic and male-pattern balding.   Eyes:  pupils equal and pupils round.   Ears:  R ear normal and L ear normal.   Nose:  no external deformity and no nasal  discharge.  no external deformity.   Mouth:  good dentition and pharynx pink and moist.   Neck:  No deformities, masses, or tenderness noted. Lungs:  Normal respiratory effort, chest expands symmetrically. Lungs are clear to auscultation, no crackles or wheezes. Heart:  Normal rate and regular rhythm. S1 and S2 normal without gallop, murmur, click, rub or other extra sounds. Abdomen:  soft and non-tender.  obese Msk:  normal ROM and no joint tenderness.   Extremities:  1+ left pedal edema and 1+ right pedal edema.   Neurologic:  alert & oriented X3 and DTRs symmetrical and normal.     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 1 once daily    Kombiglyze Xr 2.07-998 Mg Xr24h-tab (Saxagliptin-metformin) ..... One by mouth daily  Labs  Reviewed: Creat: 0.9 (11/08/2007)     Orders: Venipuncture (16109) TLB-BMP (Basic Metabolic Panel-BMET) (80048-METABOL) TLB-A1C / Hgb A1C (Glycohemoglobin) (83036-A1C) Specimen Handling (60454)  Problem # 2:  HYPERTENSION, MILD (ICD-401.1) Assessment: Unchanged  His updated medication list for this problem includes:    Atacand 16 Mg Tabs (Candesartan cilexetil) .Marland Kitchen... 1 once daily  BP today: 134/80 Prior BP: 144/90 (04/02/2009)  10 Yr Risk Heart Disease: 22 % Prior 10 Yr Risk Heart Disease: 18 % (11/05/2008)  Labs Reviewed: K+: 4.4 (11/08/2007) Creat: : 0.9 (11/08/2007)   Chol: 184 (10/22/2008)   HDL: 26.30 (10/22/2008)   LDL: 121 (10/22/2008)   TG: 182.0 (10/22/2008)  Problem # 3:  HYPERLIPIDEMIA (ICD-272.4) diet is key Labs Reviewed: SGOT: 21 (07/16/2008)   SGPT: 29 (07/16/2008)  Lipid Goals: Chol Goal: 200 (07/23/2008)   HDL Goal: 40 (07/23/2008)   LDL Goal: 130 (07/23/2008)   TG Goal: 150 (07/23/2008)  10 Yr Risk Heart Disease: 22 % Prior 10 Yr Risk Heart Disease: 18 % (11/05/2008)   HDL:26.30 (10/22/2008), 20.90 (07/16/2008)  LDL:121 (10/22/2008), 142 (07/16/2008)  Chol:184 (10/22/2008), 188 (07/16/2008)  Trig:182.0 (10/22/2008), 124.0 (07/16/2008)  Problem # 4:  DEPRESSION (ICD-311)  His updated medication list for this problem includes:    Cymbalta 60 Mg Cpep (Duloxetine hcl) .Marland Kitchen... 1 once daily    Ativan 1 Mg Tabs (Lorazepam) .Marland Kitchen... As needed    Valium 5 Mg Tabs (Diazepam) .Marland Kitchen... 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  nycneta for pain and MRI to document progression  Orders: Radiology Referral (Radiology)  Complete Medication List: 1)  Nexium 40 Mg Cpdr (Esomeprazole magnesium) .... One by mouth daily 2)  Allerex  .Marland KitchenMarland KitchenMarland Kitchen 1 two times a day 3)  Flonase 50 Mcg/act Susp (Fluticasone propionate) .... Once daily 4)  Celebrex 200 Mg Caps  (Celecoxib) .Marland Kitchen.. 1 two times a day 5)  Cymbalta 60 Mg Cpep (Duloxetine hcl) .Marland Kitchen.. 1 once daily 6)  Colchicine 0.6 Mg Tabs (Colchicine) .Marland Kitchen.. 1 once daily  as needed 7)  Flexeril 10 Mg Tabs (Cyclobenzaprine hcl) .Marland Kitchen.. 1 three times a day as needed 8)  Tramadol Hcl 50 Mg Tabs (Tramadol hcl) .Marland KitchenMarland Kitchen. 1 w/ tylneol 325 every 4-6 hours as needed pain 9)  Ativan 1 Mg Tabs (Lorazepam) .... As needed 10)  Doxycycline Hyclate 100 Mg Cpep (Doxycycline hyclate) .... As needed 11)  Astelin 137 Mcg/spray Soln (Azelastine hcl) .... As  needed 12)  Allergy Injections  13)  Krill Oil 1000 Mg Caps (Krill oil) .... 2 once daily 14)  Diclofenac Sodium 0.1 % Soln (Diclofenac sodium) .... Apply as directed 15)  D3-50 50000 Unit Caps (Cholecalciferol) .... One by mouth twice a week 16)  Cialis 20 Mg Tabs (Tadalafil) .... Take as directed 17)  Melatonin 3 Mg Tabs (Melatonin) .Marland Kitchen.. 1 at bedtime as needed 18)  Mentax 1 % Crea (Butenafine hcl) .... Use as directed 19)  Atacand 16 Mg Tabs (Candesartan cilexetil) .Marland Kitchen.. 1 once daily 20)  Valium 5 Mg Tabs (Diazepam) .Marland Kitchen.. 1 at bedtime as needed sleep 21)  Kombiglyze Xr 2.07-998 Mg Xr24h-tab (Saxagliptin-metformin) .... One by mouth daily 22)  Nucynta 100 Mg Tabs (Tapentadol hcl) .... One by mouth daily  Hypertension Assessment/Plan:      The patient's hypertensive risk group is category C: Target organ damage and/or diabetes.  His calculated 10 year risk of coronary heart disease is 22 %.  Today's blood pressure is 134/80.  His blood pressure goal is < 140/90.  Patient Instructions: 1)  Please schedule a follow-up appointment in 2 months. 2)  HbgA1C prior to visit, ICD-9:250.00 Prescriptions: NUCYNTA 100 MG TABS (TAPENTADOL HCL) one by mouth daily  #30 x 0   Entered and Authorized by:   Stacie Glaze MD   Signed by:   Stacie Glaze MD on 11/05/2009   Method used:   Print then Give to Patient   RxID:   [id number] D3-50 50000 UNIT CAPS (CHOLECALCIFEROL) one by mouth  twice a week  #30 x 3   Entered and Authorized by:   Stacie Glaze MD   Signed by:   Stacie Glaze MD on 11/05/2009   Method used:   Electronically to        [facility]* (retail)       [address]       [place], [place]  [address]       Ph: [phone]       Fax: [phone]   RxID:   [id number]

## 2010-04-30 NOTE — Progress Notes (Signed)
----   Converted from flag ---- ---- 06/20/2009 2:55 PM, Layne Benton, RN, BSN wrote: I will give him samples to try and let you know how it is going. Thanks...have a good weekend!  ---- 06/20/2009 2:02 PM, Willy Eddy, LPN wrote: Dr Lovell Sheehan said he could take atacand 16mg  once a day  ---- 06/20/2009 12:01 PM, Layne Benton, RN, BSN wrote: Gaynell Face stopped the Benicar because of itchy rash on 3/17 which has now resolved. Please note the following BP's... 3/20 140/98,3/23 160/90, 3/24 150/94...we only have samples of Atacand and I have Cozaar at home. Please let me know what Dr. Lovell Sheehan would like to order.  Thanks Carlyon Shadow ------------------------------

## 2010-04-30 NOTE — Assessment & Plan Note (Signed)
Summary: elevated bp/bmw   Vital Signs:  Patient profile:   55 year old male Height:      72 inches Weight:      282 pounds BMI:     38.38 Temp:     98.2 degrees F oral Pulse rate:   84 / minute Resp:     14 per minute BP sitting:   144 / 90  (left arm) Cuff size:   large  Vitals Entered By: Willy Eddy, LPN (April 02, 2009 3:49 PM) CC: c/o elevated bp, Hypertension Management   CC:  c/o elevated bp and Hypertension Management.  History of Present Illness: BLOOD PRESSURE READING REVIEWED WITH THE PT other factors chronic pain obesity, hyperlipidemia and family hx and  job stress    Hypertension History:      He denies headache, chest pain, palpitations, dyspnea with exertion, orthopnea, PND, peripheral edema, visual symptoms, neurologic problems, syncope, and side effects from treatment.        Positive major cardiovascular risk factors include male age 66 years old or older, hyperlipidemia, and hypertension.  Negative major cardiovascular risk factors include non-tobacco-user status.        Further assessment for target organ damage reveals no history of ASHD, stroke/TIA, or peripheral vascular disease.     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 One By Mouth Daily 2)  Allerex .Marland Kitchen.. 1 Two Times A Day 3)  Flonase 50 Mcg/act  Susp (Fluticasone Propionate) .... Once Daily 4)  Celebrex 200 Mg  Caps (Celecoxib) .Marland Kitchen.. 1 Two Times A Day 5)  Cymbalta 60 Mg  Cpep (Duloxetine Hcl) .Marland Kitchen.. 1 Once Daily 6)  Colchicine 0.6 Mg  Tabs (Colchicine) .Marland Kitchen.. 1  Once Daily  As Needed 7)  Flexeril 10 Mg  Tabs (Cyclobenzaprine Hcl) .Marland Kitchen.. 1 Three Times A Day As Needed 8)  Darvocet A500 100-500 Mg  Tabs (Propoxyphene N-Apap) .... As Needed 9)  Ativan 1 Mg  Tabs (Lorazepam) .... As Needed 10)  Doxycycline Hyclate 100 Mg  Cpep (Doxycycline Hyclate) .... As Needed 11)  Astelin 137 Mcg/spray  Soln (Azelastine Hcl) .... As Needed 12)  Allergy Injections 13)  Vitamin D 04540 Unit  Caps (Ergocalciferol) .... One By Mouth Weekly 14)  Krill Oil 1000 Mg Caps (Krill Oil) .Marland Kitchen.. 1 Once Daily 15)  Testosterone Cypionate 200 Mg/ml  Oil (Testosterone Cypionate) .... 1/2 Cc Every Two Weeks 16)  Diclofenac Sodium 0.1 % Soln (Diclofenac Sodium) .... Apply As Directed 17)  Cialis 20 Mg Tabs (Tadalafil) .... As Directed 18)  Vitamin D (Ergocalciferol) 50000 Unit Caps (Ergocalciferol) .Marland Kitchen.. 1 Every Month 19)  Calcitriol 0.5 Mcg Caps (Calcitriol) .... One By Mouth Daily 20)  Cialis 20 Mg Tabs (Tadalafil) .... Take As Directed  Current Medications (verified): 1)  Nexium 40 Mg  Cpdr (Esomeprazole Magnesium) .... One By Mouth Daily 2)  Allerex .Marland Kitchen.. 1 Two Times A Day 3)  Flonase 50 Mcg/act  Susp (Fluticasone Propionate) .... Once Daily 4)  Celebrex 200 Mg  Caps (Celecoxib) .Marland Kitchen.. 1 Two Times A Day 5)  Cymbalta 60 Mg  Cpep (Duloxetine Hcl) .Marland Kitchen.. 1 Once Daily 6)  Colchicine 0.6 Mg  Tabs (Colchicine) .Marland Kitchen.. 1 Once Daily  As Needed 7)  Flexeril 10 Mg  Tabs (Cyclobenzaprine Hcl) .Marland Kitchen.. 1 Three Times A Day As Needed 8)  Tramadol Hcl 50 Mg Tabs (Tramadol Hcl) .Marland Kitchen.. 1 W/ Tylneol 325 Every 4-6 Hours As Needed Pain 9)  Ativan 1 Mg  Tabs (Lorazepam) .... As Needed 10)  Doxycycline Hyclate 100 Mg  Cpep (Doxycycline Hyclate) .... As Needed 11)  Astelin 137 Mcg/spray  Soln (Azelastine Hcl) .... As Needed 12)  Allergy Injections 13)  Krill Oil 1000 Mg Caps (Krill Oil) .Marland Kitchen.. 1 Once Daily 14)  Diclofenac Sodium 0.1 % Soln (Diclofenac Sodium) .... Apply As Directed 15)  Cialis 20 Mg Tabs (Tadalafil)  .... As Directed 16)  Vitamin D (Ergocalciferol) 50000 Unit Caps (Ergocalciferol) .Marland Kitchen.. 1 Every Month 17)  Calcitriol 0.5 Mcg Caps (Calcitriol) .... One By Mouth Daily 18)  Cialis 20 Mg Tabs (Tadalafil) .... Take As Directed 19)  Melatonin 3 Mg Tabs (Melatonin) .Marland Kitchen.. 1 At Bedtime As Needed 20)  Mentax 1 % Crea (Butenafine Hcl) .... Use As Directed  Allergies (verified): No Known Drug Allergies  Past History:  Family History: Last updated: 04/02/2009 Family History Hypertension MOTHER AND GRANDMOTHER AND SISTER ( MOTHER IN 16's)  Social History: Last updated: 11/14/2007 Married Never Smoked  Risk Factors: Smoking Status: never (04/02/2009)  Past medical, surgical, family and social histories (including risk factors) reviewed, and no changes noted (except as noted below).  Past Medical History: Reviewed history from 11/14/2007 and no changes required. Anxiety Depression Gout D[facility] Severe seasonal allergies Hyperlipidemia  Past Surgical History: Reviewed history from 11/17/2006 and no changes required. Colonoscopy-02/08/2005  Family History: Reviewed history from 11/14/2007 and no changes required. Family History Hypertension MOTHER AND GRANDMOTHER AND SISTER ( MOTHER IN 74's)  Social History: Reviewed history from 11/14/2007 and no changes required. Married Never Smoked  Review of Systems  The patient denies anorexia, fever, weight loss, weight gain, vision loss, decreased hearing, hoarseness, chest pain, syncope, dyspnea on exertion, peripheral edema, prolonged cough, headaches, hemoptysis, abdominal pain, melena, hematochezia, severe indigestion/heartburn, hematuria, incontinence, genital sores, muscle weakness, suspicious skin lesions, transient blindness, difficulty walking, depression, unusual weight change, abnormal bleeding, enlarged lymph nodes, angioedema, and breast masses.    Physical Exam  General:  alert and overweight-appearing.   Head:  normocephalic  and male-pattern balding.   Eyes:  pupils equal and pupils round.   Ears:  R ear normal and L ear normal.   Nose:  no external deformity and no nasal discharge.  no external deformity.   Mouth:  good dentition and pharynx pink and moist.   Neck:  No deformities, masses, or tenderness noted. Lungs:  Normal respiratory effort, chest expands symmetrically. Lungs are clear to auscultation, no crackles or wheezes. Heart:  Normal rate and regular rhythm. S1 and S2 normal without gallop, murmur, click, rub or other extra sounds. Abdomen:  soft and non-tender.  obese Msk:  normal ROM and no joint tenderness.   Neurologic:  alert & oriented X3 and DTRs symmetrical and normal.     Impression & Recommendations:  Problem # 1:  HYPERLIPIDEMIA (ICD-272.4)  Labs Reviewed: SGOT: 21 (07/16/2008)   SGPT: 29 (07/16/2008)  Lipid Goals: Chol Goal: 200 (07/23/2008)   HDL Goal: 40 (07/23/2008)   LDL Goal: 130 (07/23/2008)   TG Goal: 150 (07/23/2008)  Prior 10 Yr Risk Heart Disease: 18 % (11/05/2008)   HDL:26.30 (10/22/2008), 20.90 (  07/16/2008)  EXB:284 (10/22/2008), 142 (07/16/2008)  Chol:184 (10/22/2008), 188 (07/16/2008)  Trig:182.0 (10/22/2008), 124.0 (07/16/2008)  Problem # 2:  HYPERTENSION, MILD (ICD-401.1) Assessment: Unchanged  BP today: 144/90 Prior BP: 144/88 (11/05/2008)  Prior 10 Yr Risk Heart Disease: 18 % (11/05/2008)  Labs Reviewed: K+: 4.4 (11/08/2007) Creat: : 0.9 (11/08/2007)   Chol: 184 (10/22/2008)   HDL: 26.30 (10/22/2008)   LDL: 121 (10/22/2008)   TG: 182.0 (10/22/2008)  His updated medication list for this problem includes:    Benicar 20 Mg Tabs (Olmesartan medoxomil) ..... One by mouth daily  Orders: Venipuncture (13244) TLB-Calcium (82310-CA) T-Vitamin D (25-Hydroxy) ([address]27-25[address])  Complete Medication List: 1)  Nexium 40 Mg Cpdr (Esomeprazole magnesium) .... One by mouth daily 2)  Allerex  .Marland KitchenMarland KitchenMarland Kitchen 1 two times a day 3)  Flonase 50 Mcg/act Susp (Fluticasone propionate)  .... Once daily 4)  Celebrex 200 Mg Caps (Celecoxib) .Marland Kitchen.. 1 two times a day 5)  Cymbalta 60 Mg Cpep (Duloxetine hcl) .Marland Kitchen.. 1 once daily 6)  Colchicine 0.6 Mg Tabs (Colchicine) .Marland Kitchen.. 1 once daily  as needed 7)  Flexeril 10 Mg Tabs (Cyclobenzaprine hcl) .Marland Kitchen.. 1 three times a day as needed 8)  Tramadol Hcl 50 Mg Tabs (Tramadol hcl) .Marland KitchenMarland Kitchen. 1 w/ tylneol 325 every 4-6 hours as needed pain 9)  Ativan 1 Mg Tabs (Lorazepam) .... As needed 10)  Doxycycline Hyclate 100 Mg Cpep (Doxycycline hyclate) .... As needed 11)  Astelin 137 Mcg/spray Soln (Azelastine hcl) .... As needed 12)  Allergy Injections  13)  Krill Oil 1000 Mg Caps (Krill oil) .Marland Kitchen.. 1 once daily 14)  Diclofenac Sodium 0.1 % Soln (Diclofenac sodium) .... Apply as directed 15)  Cialis 20 Mg Tabs (Tadalafil) .... As directed 16)  Vitamin D (ergocalciferol) 50000 Unit Caps (Ergocalciferol) .Marland Kitchen.. 1 every month 17)  Calcitriol 0.5 Mcg Caps (Calcitriol) .... One by mouth daily 18)  Cialis 20 Mg Tabs (Tadalafil) .... Take as directed 19)  Melatonin 3 Mg Tabs (Melatonin) .Marland Kitchen.. 1 at bedtime as needed 20)  Mentax 1 % Crea (Butenafine hcl) .... Use as directed 21)  Benicar 20 Mg Tabs (Olmesartan medoxomil) .... One by mouth daily  Hypertension Assessment/Plan:      The patient's hypertensive risk group is category B: At least one risk factor (excluding diabetes) with no target organ damage.  His calculated 10 year risk of coronary heart disease is 18 %.  Today's blood pressure is 144/90.  His blood pressure goal is < 140/90.  Patient Instructions: 1)  Please schedule a follow-up appointment in 2 months. Prescriptions: MENTAX 1 % CREA (BUTENAFINE HCL) use as directed  #1tube x 3   Entered by:   Willy Eddy, LPN   Authorized by:   Stacie Glaze MD   Signed by:   Willy Eddy, LPN on 44/05/4740   Method used:   Electronically to        [facility]* (retail)       [address] [place], [place]  [address]       Ph: [phone] or [phone]       Fax: [phone]   RxID:   [id number] TRAMADOL HCL 50 MG TABS (TRAMADOL HCL) 1 w/ tylneol 325 every 4-6 hours as needed pain  #30 x 6   Entered by:   Willy Eddy, LPN   Authorized by:   Stacie Glaze  MD   Signed by:   Willy Eddy, LPN on 04/54/0981   Method used:   Electronically to        [facility]* (retail)       [address] [place], [place]  [address]       Ph: [phone] or [phone]       Fax: [phone]   RxID:   [id number]

## 2010-04-30 NOTE — Letter (Signed)
Summary: [facility]  [facility]   Imported By: Maryln Gottron 12/25/2009 15:10:04  _____________________________________________________________________  External Attachment:    Type:   Image     Comment:   External Document

## 2010-05-13 ENCOUNTER — Encounter (HOSPITAL_COMMUNITY): Payer: Self-pay | Admitting: Psychiatry

## 2010-05-14 NOTE — Assessment & Plan Note (Signed)
Summary: 1 month rov/njr   Vital Signs:  Patient profile:   55 year old male Height:      72 inches Weight:      272 pounds BMI:     37.02 Temp:     98.2 degrees F oral Pulse rate:   840 / minute Resp:     16 per minute BP sitting:   136 / 84  (left arm) Cuff size:   large  Vitals Entered By: Willy Eddy, LPN (April 22, 2010 3:59 PM) CC: roa-c/o neck pain Is Patient Diabetic? No   Primary Care Provider:  Stacie Glaze MD  CC:  roa-c/o neck pain.  History of Present Illness:  the patient presents for followup of diffuse idiopathic hypertrophic  skeletal ostosis-  the patient is having episodes of severe pain on the left side of his neck radiating into his scalp the pain is rated as a 10 over 10 and he compares it to a migraine-like pain when he reaches his scalp the pain radiates into his shoulder as well and requires narcotic pain relief as well as interferes with his ability to function in his job.  The pain as well as his chronic illness in fact his ability to perform his job not only because of the medication he has to take for the pain but also the pain interferes with his short-term memory and concentration.   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) 7)  Hyperlipidemia  (ICD-272.4) 8)  Osteoporosis  (ICD-733.00) 9)  Physical Examination, Normal  (ICD-V70.0) 10)  Gout  (ICD-274.9) 11)  Depression  (ICD-311) 12)  Anxiety  (ICD-300.00)  Current Problems (verified): 1)  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 One By Mouth Daily 2)  Allerex .Marland Kitchen.. 1 Two Times A Day 3)  Flonase 50 Mcg/act  Susp (Fluticasone Propionate) .... Once Daily 4)  Celebrex 200 Mg  Caps (Celecoxib) .Marland Kitchen.. 1 Two Times A Day 5)  Cymbalta 60 Mg  Cpep (Duloxetine Hcl) .Marland Kitchen.. 1 Once Daily 6)  Colchicine 0.6 Mg  Tabs (Colchicine) .Marland Kitchen.. 1 Once Daily  As Needed 7)  Flexeril 10 Mg  Tabs (Cyclobenzaprine Hcl) .Marland Kitchen.. 1 Three Times A Day As Needed 8)  Ativan 1 Mg  Tabs (Lorazepam) .... As Needed 9)  Doxycycline Hyclate 100 Mg  Cpep (Doxycycline Hyclate) .... As Needed 10)  Astelin 137 Mcg/spray  Soln (Azelastine Hcl) .... As Needed 11)  Allergy Injections 12)  Krill Oil 1000 Mg Caps (Krill Oil) .... 2 Once Daily 13)  Diclofenac Sodium 0.1 % Soln (Diclofenac Sodium) .... Apply As Directed 14)  D3-50 50000 Unit Caps (Cholecalciferol) .... One By Mouth  Twice A Week 15)  Cialis 20 Mg Tabs (Tadalafil) .... Take As Directed 16)  Melatonin 3 Mg Tabs (Melatonin) .Marland Kitchen.. 1 At Bedtime As Needed 17)  Mentax 1 % Crea (Butenafine Hcl) .... Use As Directed 18)  Atacand 16 Mg Tabs (Candesartan Cilexetil) .Marland Kitchen.. 1 Once Daily 19)  Valium 5 Mg Tabs (Diazepam) .Marland Kitchen.. 1 At Bedtime As Needed Sleep 20)  Nucynta 100 Mg Tabs (Tapentadol Hcl) .... One By Mouth Daily  Current Medications (verified): 1)  Nexium 40 Mg  Cpdr (Esomeprazole Magnesium) .... One By Mouth Daily 2)  Allerex .Marland Kitchen.. 1 Two Times A Day 3)  Flonase 50 Mcg/act  Susp (Fluticasone Propionate) .... Once Daily 4)  Celebrex 200 Mg  Caps (Celecoxib) .Marland Kitchen.. 1 Two Times A Day 5)  Cymbalta 60 Mg   Cpep (Duloxetine Hcl) .Marland Kitchen.. 1 Once Daily 6)  Colchicine 0.6 Mg  Tabs (Colchicine) .Marland Kitchen.. 1 Once Daily  As Needed 7)  Flexeril 10 Mg  Tabs (Cyclobenzaprine Hcl) .Marland Kitchen.. 1 Three Times A Day As Needed 8)  Ativan 1 Mg  Tabs (Lorazepam) .... As Needed 9)  Doxycycline Hyclate 100 Mg  Cpep (Doxycycline Hyclate) .... As Needed 10)  Astelin 137 Mcg/spray  Soln (Azelastine Hcl) .... As Needed 11)  Allergy Injections 12)  Krill Oil 1000 Mg Caps (Krill Oil) .... 2 Once Daily 13)  Diclofenac Sodium 0.1 % Soln (Diclofenac Sodium) .... Apply As Directed 14)  D3-50 50000 Unit Caps (Cholecalciferol) .... One By Mouth Twice A Week 15)  Cialis 20 Mg Tabs (Tadalafil) .... Take As Directed 16)  Melatonin 3 Mg Tabs (Melatonin) .Marland Kitchen.. 1 At Bedtime As Needed 17)  Mentax 1 % Crea (Butenafine Hcl) .... Use As Directed 18)  Atacand 16 Mg Tabs (Candesartan Cilexetil) .Marland Kitchen.. 1 Once Daily 19)  Valium 5 Mg Tabs (Diazepam) .Marland Kitchen.. 1 At Bedtime As Needed Sleep 20)  Nucynta 100 Mg Tabs (Tapentadol Hcl) .... One By Mouth Daily 21)  Baclofen 10 Mg Tabs (Baclofen) .... One By Mouth Tid  Allergies (verified): No Known Drug Allergies  Past History:  Family History: Last updated: 04/02/2009 Family History Hypertension MOTHER AND GRANDMOTHER AND SISTER ( MOTHER IN 42's)  Social History: Last updated: 11/14/2007 Married Never Smoked  Risk Factors: Smoking Status: never (04/22/2010)  Past medical, surgical, family and social histories (including risk factors) reviewed, and no changes noted (except as noted below).  Past Medical History: Reviewed history from 11/14/2007 and no changes required. Anxiety Depression Gout D[facility] Severe seasonal allergies Hyperlipidemia  Past Surgical History: Reviewed history from 11/17/2006 and no changes required. Colonoscopy-02/08/2005  Family History: Reviewed history from 04/02/2009 and no changes required. Family History Hypertension MOTHER AND GRANDMOTHER AND SISTER ( MOTHER IN  48's)  Social History: Reviewed history from 11/14/2007 and no changes required. Married Never Smoked  Review of Systems  The patient denies anorexia, fever, weight loss, weight gain, vision loss, decreased hearing, hoarseness, chest pain, syncope, dyspnea on exertion, peripheral edema, prolonged cough, headaches, hemoptysis, abdominal pain, melena, hematochezia, severe indigestion/heartburn, hematuria, incontinence, genital sores, muscle weakness, suspicious skin lesions, transient blindness, difficulty walking, depression, unusual weight change, abnormal bleeding, enlarged lymph nodes, angioedema, breast masses, and testicular masses.    Physical Exam  General:  alert and overweight-appearing.   Head:  normocephalic and male-pattern balding.   Eyes:  pupils equal and pupils round.   Ears:  R ear normal and L ear normal.   Nose:  no external deformity and no nasal discharge.  no external deformity.  Mouth:  good dentition and pharynx pink and moist.   Neck:  nuchal rigidity and decreased ROM.   Lungs:  Normal respiratory effort, chest expands symmetrically. Lungs are clear to auscultation, no crackles or wheezes. Heart:  Normal rate and regular rhythm. S1 and S2 normal without gallop, murmur, click, rub or other extra sounds. Abdomen:  soft and non-tender.  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 1 once daily  BP today: 136/84 Prior BP: 140/80 (03/13/2010)  Prior 10 Yr Risk Heart Disease: 27 % (02/10/2010)  Labs Reviewed: K+: 4.2 (02/04/2010) Creat: : 0.9 (02/04/2010)   Chol: 216 (11/03/2009)   HDL: 33.50 (11/03/2009)   LDL: 121 (10/22/2008)   TG: 174.0 (11/03/2009)  Problem # 4:  NECK PAIN, CHRONIC (ICD-723.1) Assessment: Deteriorated  His updated medication list for this problem includes:    Celebrex 200 Mg Caps (Celecoxib) .Marland Kitchen... 1 two times a day    Flexeril 10 Mg Tabs (Cyclobenzaprine hcl) .Marland Kitchen... 1 three times a day as needed    Nucynta 100 Mg Tabs (Tapentadol hcl) ..... One by mouth daily    Baclofen 10 Mg Tabs (Baclofen) ..... One by mouth tid  Discussed exercises and use of moist heat or cold and medication.   Complete Medication List: 1)  Nexium 40 Mg Cpdr (Esomeprazole magnesium) .... One by mouth daily 2)  Allerex  .Marland KitchenMarland KitchenMarland Kitchen 1 two times a day 3)  Flonase 50 Mcg/act Susp (Fluticasone propionate) .... Once daily 4)  Celebrex 200 Mg Caps (Celecoxib) .Marland Kitchen.. 1 two times a day 5)  Cymbalta 60 Mg Cpep (Duloxetine hcl) .Marland Kitchen.. 1 once daily 6)  Colchicine 0.6 Mg Tabs (Colchicine) .Marland Kitchen.. 1 once daily  as needed 7)  Flexeril 10 Mg Tabs (Cyclobenzaprine hcl) .Marland Kitchen.. 1 three times a day as needed 8)  Ativan 1 Mg Tabs (Lorazepam) .... As needed 9)  Doxycycline Hyclate 100 Mg Cpep (Doxycycline hyclate) .... As needed 10)  Astelin 137 Mcg/spray Soln (Azelastine hcl) .... As needed 11)  Allergy Injections  12)  Krill Oil 1000 Mg Caps (Krill oil) .... 2 once daily 13)  Diclofenac Sodium 0.1 % Soln (Diclofenac sodium) .... Apply as directed 14)  D3-50 50000 Unit Caps (Cholecalciferol) .... One by mouth twice a week 15)  Cialis 20 Mg Tabs (Tadalafil) .... Take as directed 16)  Melatonin 3 Mg Tabs (Melatonin) .Marland Kitchen.. 1 at bedtime as needed 17)  Mentax 1 % Crea (Butenafine hcl) .... Use as directed 18)  Atacand 16 Mg Tabs (Candesartan  cilexetil) .Marland Kitchen.. 1 once daily 19)  Valium 5 Mg Tabs (Diazepam) .Marland Kitchen.. 1 at bedtime as needed sleep 20)  Nucynta 100 Mg Tabs (Tapentadol hcl) .... One by mouth daily 21)  Baclofen 10 Mg Tabs (Baclofen) .... One by mouth tid  Patient Instructions: 1)  Please schedule a follow-up appointment in 2 months. Prescriptions: BACLOFEN 10 MG TABS (BACLOFEN) one by mouth tid  #  90 x 3   Entered and Authorized by:   Stacie Glaze MD   Signed by:   Stacie Glaze MD on 04/22/2010   Method used:   Electronically to        [facility]* (retail)       [address] [place], [place]  [address]       Ph: [phone] or [phone]       Fax: [phone]   RxID:   [id number] FLONASE 50 MCG/ACT  SUSP (FLUTICASONE PROPIONATE) once daily  #3 x 3   Entered by:   Willy Eddy, LPN   Authorized by:   Stacie Glaze MD   Signed by:   Willy Eddy, LPN on 27/25/3664   Method used:   Print then Give to Patient   RxID:   [id number]    Orders Added: 1)  Est. Patient Level IV [43329]    Prevention & Chronic Care Immunizations   Influenza vaccine: Fluvax 3+  (02/10/2010)    Tetanus booster: 03/29/2001: Historical    Pneumococcal vaccine: Not documented  Colorectal Screening   Hemoccult: Not documented    Colonoscopy: Not documented  Other Screening   PSA: 1.66  (11/03/2009)   Smoking status: never  (04/22/2010)  Diabetes Mellitus   HgbA1C: 6.0  (03/09/2010)    Eye exam: Not documented    Foot exam: Not documented   High risk foot: Not documented   Foot care education: Not documented    Urine microalbumin/creatinine ratio: Not documented  Lipids   Total Cholesterol: 216  (11/03/2009)   LDL: 121  (10/22/2008)   LDL Direct: 158.0  (11/03/2009)   HDL: 33.50  (11/03/2009)   Triglycerides: 174.0  (11/03/2009)    SGOT (AST): 23  (02/04/2010)   SGPT (ALT): 25  (02/04/2010)   Alkaline phosphatase: 81   (02/04/2010)   Total bilirubin: 1.1  (02/04/2010)  Hypertension   Last Blood Pressure: 136 / 84  (04/22/2010)   Serum creatinine: 0.9  (02/04/2010)   Serum potassium 4.2  (02/04/2010)  Self-Management Support :    Diabetes self-management support: Not documented    Hypertension self-management support: Not documented    Lipid self-management support: Not documented

## 2010-05-25 ENCOUNTER — Encounter (HOSPITAL_COMMUNITY): Payer: Self-pay | Admitting: Psychiatry

## 2010-05-25 DIAGNOSIS — F331 Major depressive disorder, recurrent, moderate: Secondary | ICD-10-CM

## 2010-06-22 ENCOUNTER — Encounter: Payer: Self-pay | Admitting: Internal Medicine

## 2010-06-23 ENCOUNTER — Ambulatory Visit: Payer: BC Managed Care – PPO | Admitting: Internal Medicine

## 2010-06-23 ENCOUNTER — Encounter: Payer: Self-pay | Admitting: Internal Medicine

## 2010-06-23 DIAGNOSIS — G43909 Migraine, unspecified, not intractable, without status migrainosus: Secondary | ICD-10-CM

## 2010-06-23 DIAGNOSIS — K219 Gastro-esophageal reflux disease without esophagitis: Secondary | ICD-10-CM

## 2010-06-23 MED ORDER — TAPENTADOL HCL 75 MG PO TABS: 1.0000 | ORAL_TABLET | Freq: Two times a day (BID) | ORAL | Status: DC

## 2010-06-23 NOTE — Assessment & Plan Note (Signed)
New diagnosis Precipitated by the [facility] Head ache patter not T1

## 2010-06-23 NOTE — Progress Notes (Signed)
  Subjective:    Patient ID: Vincent [name], male    DOB: 1956-01-30, 55 y.o.   MRN: [id number]  HPI  The pt has been granted disability The pt has bee suffering form DISH  And has extreme pain  Review of Systems  Constitutional: Negative for fever and fatigue.  HENT: Negative for hearing loss, congestion, neck pain and postnasal drip.   Eyes: Negative for discharge, redness and visual disturbance.  Respiratory: Negative for cough, shortness of breath and wheezing.   Cardiovascular: Negative for leg swelling.  Gastrointestinal: Negative for abdominal pain, constipation and abdominal distention.  Genitourinary: Negative for urgency and frequency.  Musculoskeletal: Positive for myalgias, back pain, joint swelling and arthralgias.  Skin: Negative for color change and rash.  Neurological: Positive for headaches. Negative for weakness and light-headedness.  Hematological: Negative for adenopathy.  Psychiatric/Behavioral: Negative for behavioral problems.       Objective:   Physical Exam  Constitutional: He appears well-developed and well-nourished.  HENT:  Head: Normocephalic and atraumatic.  Eyes: Conjunctivae are normal. Pupils are equal, round, and reactive to light.  Neck: Normal range of motion. Neck supple.  Cardiovascular: Normal rate and regular rhythm.   Pulmonary/Chest: Effort normal and breath sounds normal.  Abdominal: Soft. Bowel sounds are normal.  Musculoskeletal:       Increased neck and pain tenderness with letf sided neck pain          Assessment & Plan:

## 2010-07-28 MED ORDER — ESOMEPRAZOLE MAGNESIUM 40 MG PO CPDR: 40.0000 mg | DELAYED_RELEASE_CAPSULE | Freq: Every day | ORAL | Status: DC

## 2010-08-14 NOTE — Procedures (Signed)
[facility]  Patient:    Vincent [name], Vincent [name]                       MRN: [id number] Proc. Date: 12/08/99 Attending:  Griffith Citron, M.D. CC:         Stacie Glaze, M.D. [facility]   Procedure Report  PROCEDURE:  Colonoscopy.  INDICATION FOR PROCEDURE:  A 55 year old white male undergoing colonoscopy for neoplasia surveillance. Significant family history with both parents having had colon cancer. The patient has not undergone prior colorectal neoplasia surveillance. Asymptomatic except for occasional left lower quadrant pain. No prior colorectal neoplasia surveillance.  DESCRIPTION OF PROCEDURE:  After reviewing the nature of the procedure with the patient including potential risks and complications, and after discussing alternative methods of diagnosis and treatment, informed consent was signed.  The patient was premedicated receiving IV sedation administered in divided doses prior to and during the course of the procedure totaling Versed 10 mg, fentanyl 100 mcg administered in divided doses.  Using the Olympus pediatric PCF-140L video colonoscope, the rectum was intubated after a normal digital examination revealing no evidence of perianal or intrarectal pathology. The scope was advanced easily around the entire length of the colon which was excellently prepped throughout.  The cecum was identified by the appendiceal orifice and ileocecal valve. Photo documentation obtained.  The scope was slowly withdrawn with careful inspection of the entire colon in a retrograde manner including retroflexed view in the vault. The only finding of note was scattered universal diverticulosis, extremely mild. No evidence of neoplasia, mucosal inflammation, vascular lesion or other finding.  The colon was decompressed, scope withdrawn. The patient tolerated the procedure without difficulty being maintained on DataScope monitor and low flow oxygen throughout.  Time 1,  technical 1, preparation 1, total score equal 3.  ASSESSMENT: 1. Diverticulosis--universal, mild. 2. No colorectal neoplasia.  RECOMMENDATIONS: 1. Annual Hemoccult--per Dr. Lovell Sheehan. 2. Repeat colonoscopy at 5 year intervals. DD:  12/08/99 TD:  12/09/99 Job: [id number]

## 2010-08-26 ENCOUNTER — Encounter: Payer: Self-pay | Admitting: Internal Medicine

## 2010-08-26 ENCOUNTER — Ambulatory Visit (INDEPENDENT_AMBULATORY_CARE_PROVIDER_SITE_OTHER): Payer: BC Managed Care – PPO | Admitting: Internal Medicine

## 2010-08-26 VITALS — BP 136/86 | HR 76 | Temp 98.2°F | Resp 16 | Ht 71.0 in | Wt 274.0 lb

## 2010-08-26 DIAGNOSIS — M948X9 Other specified disorders of cartilage, unspecified sites: Secondary | ICD-10-CM

## 2010-08-26 DIAGNOSIS — E1165 Type 2 diabetes mellitus with hyperglycemia: Secondary | ICD-10-CM

## 2010-08-26 DIAGNOSIS — I10 Essential (primary) hypertension: Secondary | ICD-10-CM

## 2010-08-26 DIAGNOSIS — E785 Hyperlipidemia, unspecified: Secondary | ICD-10-CM

## 2010-08-26 DIAGNOSIS — E1169 Type 2 diabetes mellitus with other specified complication: Secondary | ICD-10-CM

## 2010-08-26 DIAGNOSIS — G43909 Migraine, unspecified, not intractable, without status migrainosus: Secondary | ICD-10-CM

## 2010-08-26 MED ORDER — CANDESARTAN CILEXETIL 16 MG PO TABS: 16.0000 mg | ORAL_TABLET | Freq: Every day | ORAL | Status: DC

## 2010-08-26 MED ORDER — ELETRIPTAN HYDROBROMIDE 40 MG PO TABS: 40.0000 mg | ORAL_TABLET | ORAL | Status: DC | PRN

## 2010-08-26 NOTE — Progress Notes (Signed)
  Subjective:    Patient ID: Vincent [name], male    DOB: Dec 20, 1955, 55 y.o.   MRN: [id number]  HPI The pts pain is increased but the medications are controlling the pain The pt has a good result from the relpax for migraines.    Review of Systems  Constitutional: Negative for fever and fatigue.  HENT: Negative for hearing loss, congestion, neck pain and postnasal drip.   Eyes: Negative for discharge, redness and visual disturbance.  Respiratory: Negative for cough, shortness of breath and wheezing.   Cardiovascular: Negative for leg swelling.  Gastrointestinal: Negative for abdominal pain, constipation and abdominal distention.  Genitourinary: Negative for urgency and frequency.  Musculoskeletal: Negative for joint swelling and arthralgias.  Skin: Negative for color change and rash.  Neurological: Positive for headaches. Negative for weakness and light-headedness.  Hematological: Negative for adenopathy.  Psychiatric/Behavioral: Negative for behavioral problems.   Past Medical History  Diagnosis Date  . Anxiety   . Depression   . Gout   . DISH (diffuse idiopathic skeletal hyperostosis)   . Seasonal allergies   . Hyperlipidemia    History reviewed. No pertinent past surgical history.  reports that he has never smoked. He does not have any smokeless tobacco history on file. He reports that he drinks alcohol. His drug history not on file. family history includes Cancer in his mother; Heart disease in his father; and Hypertension in his maternal grandmother, mother, and sister. Not on File     Objective:   Physical Exam  Constitutional: He appears well-developed and well-nourished.  HENT:  Head: Normocephalic and atraumatic.  Eyes: Conjunctivae are normal. Pupils are equal, round, and reactive to light.  Neck: Normal range of motion. Neck supple.  Cardiovascular: Normal rate and regular rhythm.   Pulmonary/Chest: Effort normal and breath sounds normal.  Abdominal: Soft.  Bowel sounds are normal.          Assessment & Plan:  1. Increased head aches that seem to respond to relpax and wants to try a longer trial of this medications 2. Stable DISH pain 3.ED 4. 2 diabetes. Patient is given a new glucometer to continue monitoring his blood glucoses we will obtain an A1c today the goal of less than 7

## 2010-09-23 ENCOUNTER — Encounter (HOSPITAL_COMMUNITY): Payer: BC Managed Care – PPO | Admitting: Psychiatry

## 2010-09-23 DIAGNOSIS — F331 Major depressive disorder, recurrent, moderate: Secondary | ICD-10-CM

## 2010-11-04 ENCOUNTER — Encounter: Payer: Self-pay | Admitting: Internal Medicine

## 2010-11-04 ENCOUNTER — Ambulatory Visit (INDEPENDENT_AMBULATORY_CARE_PROVIDER_SITE_OTHER): Payer: BC Managed Care – PPO | Admitting: Internal Medicine

## 2010-11-04 VITALS — BP 136/82 | HR 80 | Temp 98.2°F | Resp 16 | Ht 70.5 in | Wt 275.0 lb

## 2010-11-04 DIAGNOSIS — E1169 Type 2 diabetes mellitus with other specified complication: Secondary | ICD-10-CM

## 2010-11-04 DIAGNOSIS — I10 Essential (primary) hypertension: Secondary | ICD-10-CM

## 2010-11-04 DIAGNOSIS — E1165 Type 2 diabetes mellitus with hyperglycemia: Secondary | ICD-10-CM

## 2010-11-04 MED ORDER — CANDESARTAN CILEXETIL 32 MG PO TABS: 32.0000 mg | ORAL_TABLET | Freq: Every day | ORAL | Status: DC

## 2010-11-04 MED ORDER — GLUCOSE BLOOD VI STRP: ORAL_STRIP | Status: AC

## 2010-11-04 MED ORDER — GLUCOCOM LANCETS 33G MISC: 1.0000 | Freq: Two times a day (BID) | Status: DC

## 2010-11-04 NOTE — Progress Notes (Signed)
  Subjective:    Patient ID: Vincent [name], male    DOB: Sep 08, 1955, 55 y.o.   MRN: [id number]  HPI  The pt has been seeing CBG's  In the 150  Range Weight is up The pts HR is high And blood pressure is not ideally controlled No chest pain or increased sob Increased pain for the DISH Had seen Dr Arliss Journey for pain control... Trial of butrans and could not tolerate  Review of Systems  Constitutional: Negative for fever and fatigue.  HENT: Negative for hearing loss, congestion, neck pain and postnasal drip.   Eyes: Negative for discharge, redness and visual disturbance.  Respiratory: Negative for cough, shortness of breath and wheezing.   Cardiovascular: Negative for leg swelling.  Gastrointestinal: Negative for abdominal pain, constipation and abdominal distention.  Genitourinary: Negative for urgency and frequency.  Musculoskeletal: Negative for joint swelling and arthralgias.  Skin: Negative for color change and rash.  Neurological: Negative for weakness and light-headedness.  Hematological: Negative for adenopathy.  Psychiatric/Behavioral: Negative for behavioral problems.       Past Medical History  Diagnosis Date  . Anxiety   . Depression   . Gout   . DISH (diffuse idiopathic skeletal hyperostosis)   . Seasonal allergies   . Hyperlipidemia    History reviewed. No pertinent past surgical history.  reports that he has never smoked. He does not have any smokeless tobacco history on file. He reports that he drinks alcohol. He reports that he does not use illicit drugs. family history includes Cancer in his mother; Heart disease in his father; and Hypertension in his maternal grandmother, mother, and sister. No Known Allergies  Objective:   Physical Exam  Nursing note and vitals reviewed. Constitutional: He appears well-developed and well-nourished.  HENT:  Head: Normocephalic and atraumatic.  Eyes: Conjunctivae are normal. Pupils are equal, round, and reactive to  light.  Neck: Normal range of motion. Neck supple.  Cardiovascular: Normal rate and regular rhythm.   Pulmonary/Chest: Effort normal and breath sounds normal.  Abdominal: Soft. Bowel sounds are normal.          Assessment & Plan:  Patient has DISH syndrome with significant pain his blood pressure is poorly and we will increase his Atacand to 32 mg by mouth daily.  His diabetes has been fairly well controlled but we stressed the relationship between his record of a 1 cc and his weight.  He is also do a lipid profile but is not fasting today and we will do a lipid at his next visit.  We discussed weight loss diabetic control diabetic monitoring reviewed his monitoring with him.  We spent more than 30 minutes face-to-face of which half of which was counseling about his diabetes

## 2010-11-16 ENCOUNTER — Other Ambulatory Visit: Payer: Self-pay | Admitting: Internal Medicine

## 2010-11-16 MED ORDER — CELECOXIB 200 MG PO CAPS: 200.0000 mg | ORAL_CAPSULE | Freq: Two times a day (BID) | ORAL | Status: DC

## 2010-11-16 NOTE — Telephone Encounter (Signed)
rx sent electronically. 

## 2010-11-16 NOTE — Telephone Encounter (Signed)
Pt req refill of Celebrex 200 mg bid #180 to [facility] order

## 2011-01-20 ENCOUNTER — Encounter (HOSPITAL_COMMUNITY): Payer: BC Managed Care – PPO | Admitting: Psychiatry

## 2011-01-20 DIAGNOSIS — F331 Major depressive disorder, recurrent, moderate: Secondary | ICD-10-CM

## 2011-01-27 ENCOUNTER — Other Ambulatory Visit: Payer: Self-pay | Admitting: *Deleted

## 2011-01-27 MED ORDER — PSEUDOEPHEDRINE-METHSCOPOLAMIN 120-2.5 MG PO TB12: 120.0000 mg | ORAL_TABLET | Freq: Two times a day (BID) | ORAL | Status: DC

## 2011-02-03 ENCOUNTER — Encounter: Payer: Self-pay | Admitting: Internal Medicine

## 2011-02-03 ENCOUNTER — Ambulatory Visit (INDEPENDENT_AMBULATORY_CARE_PROVIDER_SITE_OTHER): Payer: BC Managed Care – PPO | Admitting: Internal Medicine

## 2011-02-03 VITALS — BP 140/82 | HR 72 | Temp 98.3°F | Resp 16 | Ht 70.5 in | Wt 270.0 lb

## 2011-02-03 DIAGNOSIS — Z1283 Encounter for screening for malignant neoplasm of skin: Secondary | ICD-10-CM

## 2011-02-03 DIAGNOSIS — C44599 Other specified malignant neoplasm of skin of other part of trunk: Secondary | ICD-10-CM

## 2011-02-03 DIAGNOSIS — Z23 Encounter for immunization: Secondary | ICD-10-CM

## 2011-02-03 DIAGNOSIS — C44509 Unspecified malignant neoplasm of skin of other part of trunk: Secondary | ICD-10-CM

## 2011-02-03 MED ORDER — AZELASTINE HCL 0.1 % NA SOLN: 1.0000 | NASAL | Status: AC | PRN

## 2011-02-03 MED ORDER — FLUTICASONE PROPIONATE 50 MCG/ACT NA SUSP: 2.0000 | Freq: Every day | NASAL | Status: DC

## 2011-02-03 MED ORDER — TRAMADOL HCL 50 MG PO TABS: 50.0000 mg | ORAL_TABLET | Freq: Four times a day (QID) | ORAL | Status: DC | PRN

## 2011-02-03 MED ORDER — DULOXETINE HCL 60 MG PO CPEP: 60.0000 mg | ORAL_CAPSULE | Freq: Every day | ORAL | Status: DC

## 2011-02-03 NOTE — Patient Instructions (Signed)
Patient was instructed to continue all medications as prescribed. To stop at the checkout desk and schedule a followup appointment  

## 2011-02-03 NOTE — Progress Notes (Signed)
  Subjective:    Patient ID: Vincent [name], male    DOB: 11/18/1955, 55 y.o.   MRN: [id number]  HPI Patient presents for skin examination.  He has multiple discolored nevi on his body and a history of prior biopsy by a dermatologist for a cancerous lesion.  We discussed sun exposure he states that he is currently not causing himself to the sudden at any time and wear sunscreen on his face and arms.  We discussed the biopsies procedure for followup of a pathologic interpretation of   Review of Systems  Constitutional: Negative for fever and fatigue.  HENT: Negative for hearing loss, congestion, neck pain and postnasal drip.   Eyes: Negative for discharge, redness and visual disturbance.  Respiratory: Negative for cough, shortness of breath and wheezing.   Cardiovascular: Negative for leg swelling.  Gastrointestinal: Negative for abdominal pain, constipation and abdominal distention.  Genitourinary: Negative for urgency and frequency.  Musculoskeletal: Negative for joint swelling and arthralgias.  Skin: Negative for color change and rash.  Neurological: Negative for weakness and light-headedness.  Hematological: Negative for adenopathy.  Psychiatric/Behavioral: Negative for behavioral problems.       Objective:   Physical Exam  Constitutional: He appears well-developed and well-nourished.  HENT:  Head: Normocephalic and atraumatic.  Eyes: Conjunctivae are normal. Pupils are equal, round, and reactive to light.  Neck: Normal range of motion. Neck supple.  Cardiovascular: Normal rate and regular rhythm.   Pulmonary/Chest: Effort normal and breath sounds normal.  Abdominal: Soft. Bowel sounds are normal.  Skin:       Multiple pigmented nevi were evaluated 3 were chosen for biopsy one on the left shoulder 1 mid back and one lower back I also removed several skin tags          Assessment & Plan:  Informed consent given and 3 lesions were removed with shave biopsy technique  one on the mid center back one in the mid center back and one on the left shoulder area.  Sterile bandages were placed and the specimens were sent for pathology evaluation

## 2011-02-03 NOTE — Progress Notes (Signed)
Addended by: Willy Eddy on: 02/03/2011 03:16 PM   Modules accepted: Orders

## 2011-03-26 NOTE — Progress Notes (Signed)
  Subjective:    Patient ID: Vincent [name], male    DOB: [birth year], 55 y.o.   MRN: [id number]  HPI    Review of Systems     Objective:   Physical Exam        Assessment & Plan:  Each shave biopsy was 1cm in size

## 2011-04-01 DIAGNOSIS — M47812 Spondylosis without myelopathy or radiculopathy, cervical region: Secondary | ICD-10-CM | POA: Diagnosis not present

## 2011-04-14 ENCOUNTER — Encounter (HOSPITAL_COMMUNITY): Payer: Self-pay | Admitting: Psychiatry

## 2011-04-14 ENCOUNTER — Ambulatory Visit (INDEPENDENT_AMBULATORY_CARE_PROVIDER_SITE_OTHER): Payer: BC Managed Care – PPO | Admitting: Psychiatry

## 2011-04-14 DIAGNOSIS — F0630 Mood disorder due to known physiological condition, unspecified: Secondary | ICD-10-CM

## 2011-04-14 MED ORDER — DULOXETINE HCL 60 MG PO CPEP: 60.0000 mg | ORAL_CAPSULE | Freq: Every day | ORAL | Status: DC

## 2011-04-14 NOTE — Progress Notes (Signed)
Patient came today for his followup appointment. He continues to have chronic pain and decreased energy. Today he feel more depressed and anxious as he complaining severe joint pain due to bad weather. Patient usually get worsening of his symptoms in in rain. He complained of poor sleep and at times having bad dreams about the past though he able to go back to sleep however sometime he get very tense and anxious. Since I saw him last he has wanted to panic attack which was out of the blue however he took lorazepam and that helped him. He does not want to change his medication and feel Cymbalta and prn Ativan working good. He reported no side effects of medication. He had quite time during holidays. [place] 2 dry climate that can help his pain. He denies any agitation anger or mood swings. He denies any crying spells or any hallucination.  Medical history Patient has history of gout which is controlled by diet, diffuse idiopathic skeletal hyperostosis, seasonal allergies, hyperlipidemia, chronic back pain.  Current medication Reviewed.  Mental status examination Patient is casually dressed and fairly groomed. He appears to somewhat tired today. His speech is slow with normal tone and volume. He is clear and coherent. He denies any active or passive suicidal thoughts or homicidal thoughts. His attention and concentration is fair. There no psychotic symptoms present. He's alert and oriented x3. He described his mood as down his affect is constricted. His insight judgment and pulse control is okay.  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 At this time he does not want to change medication. He is managing his illness on current medication. I will refill his medication Cymbalta however he still has Ativan remaining in  bottle. He will call if he needs refill on Ativan. I have explained risks and benefits of medication in detail. I recommended to call us if he has any question or concern about the medication and he feel worsening of the symptoms. I will see him again in 3 months. Time spent 30 minutes

## 2011-04-22 DIAGNOSIS — M47812 Spondylosis without myelopathy or radiculopathy, cervical region: Secondary | ICD-10-CM | POA: Diagnosis not present

## 2011-05-06 DIAGNOSIS — M47812 Spondylosis without myelopathy or radiculopathy, cervical region: Secondary | ICD-10-CM | POA: Diagnosis not present

## 2011-05-12 ENCOUNTER — Encounter: Payer: Self-pay | Admitting: Internal Medicine

## 2011-05-12 ENCOUNTER — Ambulatory Visit: Payer: BC Managed Care – PPO | Admitting: Internal Medicine

## 2011-05-12 ENCOUNTER — Ambulatory Visit (INDEPENDENT_AMBULATORY_CARE_PROVIDER_SITE_OTHER): Payer: BC Managed Care – PPO | Admitting: Internal Medicine

## 2011-05-12 DIAGNOSIS — E1169 Type 2 diabetes mellitus with other specified complication: Secondary | ICD-10-CM

## 2011-05-12 DIAGNOSIS — E1165 Type 2 diabetes mellitus with hyperglycemia: Secondary | ICD-10-CM

## 2011-05-12 DIAGNOSIS — D225 Melanocytic nevi of trunk: Secondary | ICD-10-CM

## 2011-05-12 DIAGNOSIS — L821 Other seborrheic keratosis: Secondary | ICD-10-CM | POA: Diagnosis not present

## 2011-05-12 DIAGNOSIS — D235 Other benign neoplasm of skin of trunk: Secondary | ICD-10-CM | POA: Diagnosis not present

## 2011-05-12 DIAGNOSIS — J309 Allergic rhinitis, unspecified: Secondary | ICD-10-CM | POA: Diagnosis not present

## 2011-05-12 DIAGNOSIS — D485 Neoplasm of uncertain behavior of skin: Secondary | ICD-10-CM | POA: Diagnosis not present

## 2011-05-12 MED ORDER — FLUTICASONE PROPIONATE 50 MCG/ACT NA SUSP: 2.0000 | Freq: Every day | NASAL | Status: DC

## 2011-05-12 NOTE — Progress Notes (Signed)
Addended by: Stacie Glaze on: 05/12/2011 02:49 PM   Modules accepted: Orders

## 2011-05-12 NOTE — Progress Notes (Signed)
Subjective:    Patient ID: Vincent [name], male    DOB: 13-Dec-1955, 56 y.o.   MRN: [id number]  HPI  Patient presents for reexcision of a mole on the back without significant atypia of the melanocytic type and the deep margin was involved there was a question of involvement of the margins and the other 2 lesions but they were examined today there were no pigmented residual seen and will monitor those at each followup visit.  However this one because the margin was the deep margin. Reexcision was recommended and pursued.  The patient also had a similar mole about 1 inch from the original mole that had similar characteristics to the mole that was removed so we thought it was prudent to remove that also at this time  Review of Systems  Constitutional: Negative for fever and fatigue.  HENT: Negative for hearing loss, congestion, neck pain and postnasal drip.   Eyes: Negative for discharge, redness and visual disturbance.  Respiratory: Negative for cough, shortness of breath and wheezing.   Cardiovascular: Negative for leg swelling.  Gastrointestinal: Negative for abdominal pain, constipation and abdominal distention.  Genitourinary: Negative for urgency and frequency.  Musculoskeletal: Negative for joint swelling and arthralgias.  Skin: Negative for color change and rash.  Neurological: Negative for weakness and light-headedness.  Hematological: Negative for adenopathy.  Psychiatric/Behavioral: Negative for behavioral problems.   Past Medical History  Diagnosis Date  . Anxiety   . Depression   . Gout   . DISH (diffuse idiopathic skeletal hyperostosis)   . Seasonal allergies   . Hyperlipidemia     History   Social History  . Marital Status: Married    Spouse Name: N/A    Number of Children: N/A  . Years of Education: N/A   Occupational History  . Not on file.   Social History Main Topics  . Smoking status: Never Smoker   . Smokeless tobacco: Not on file  . Alcohol Use:  Yes  . Drug Use: No  . Sexually Active: Yes   Other Topics Concern  . Not on file   Social History Narrative  . No narrative on file    No past surgical history on file.  Family History  Problem Relation Age of Onset  . Hypertension Mother   . Cancer Mother     uterine and colon cancers  . Hypertension Sister   . Hypertension Maternal Grandmother   . Heart disease Father     No Known Allergies  Current Outpatient Prescriptions on File Prior to Visit  Medication Sig Dispense Refill  . azelastine (ASTELIN) 137 MCG/SPRAY nasal spray Place 1 spray into the nose as needed. Use in each nostril as directed  90 mL  3  . baclofen (LIORESAL) 10 MG tablet Take 10 mg by mouth 3 (three) times daily.        . candesartan (ATACAND) 32 MG tablet Take 1 tablet (32 mg total) by mouth daily.  90 tablet  3  . celecoxib (CELEBREX) 200 MG capsule Take 1 capsule (200 mg total) by mouth 2 (two) times daily.  90 capsule  3  . Cholecalciferol 50000 UNITS capsule Take 50,000 Units by mouth 2 (two) times a week.        . cyclobenzaprine (FLEXERIL) 10 MG tablet Take 10 mg by mouth 3 (three) times daily as needed.        . diclofenac sodium (VOLTAREN) 1 % GEL Apply topically as needed.        Marland Kitchen  DULoxetine (CYMBALTA) 60 MG capsule Take 1 capsule (60 mg total) by mouth daily.  90 capsule  0  . eletriptan (RELPAX) 40 MG tablet One tablet by mouth as needed for migraine headache.  If the headache improves and then returns, dose may be repeated after 2 hours have elapsed since first dose (do not exceed 80 mg per day).  10 tablet  11  . esomeprazole (NEXIUM) 40 MG capsule Take 1 capsule (40 mg total) by mouth daily before breakfast.  90 capsule  3  . fluticasone (FLONASE) 50 MCG/ACT nasal spray Place 2 sprays into the nose daily. 2 sprays each nostril daily   48 g  3  . GLUCOCOM LANCETS 33G MISC 1 each by Does not apply route 2 (two) times daily before a meal.  200 each  3  . glucose blood (BAYER CONTOUR TEST)  test strip Use as instructed  200 each  3  . KRILL OIL 1000 MG CAPS Take by mouth 2 (two) times daily.        Marland Kitchen LORazepam (ATIVAN) 1 MG tablet Take 1 mg by mouth every 8 (eight) hours as needed.        . Melatonin 3 MG CAPS Take by mouth at bedtime as needed.        . NON FORMULARY Allergy Injections       . NON FORMULARY allrex- 1 two times a day       . tadalafil (CIALIS) 20 MG tablet Take 20 mg by mouth daily as needed.        . traMADol (ULTRAM) 50 MG tablet Take 1 tablet (50 mg total) by mouth every 6 (six) hours as needed for pain. Maximum dose= 8 tablets per day  30 tablet  3    BP 146/84  Pulse 84  Temp 98.1 F (36.7 C)  Resp 16  Ht 5' 10.5" (1.791 m)  Wt 273 lb (123.832 kg)  BMI 38.62 kg/m2        Objective:   Physical Exam  Nursing note reviewed. Constitutional: He appears well-developed and well-nourished.  HENT:  Head: Normocephalic and atraumatic.  Eyes: Conjunctivae are normal. Pupils are equal, round, and reactive to light.  Neck: Normal range of motion. Neck supple.  Cardiovascular: Normal rate and regular rhythm.   Pulmonary/Chest: Effort normal and breath sounds normal.  Abdominal: Soft. Bowel sounds are normal.          Assessment & Plan:  Informed consent was given by the patient for a shave biopsy. The site was prepped with Betadine and using a 15 blade a 1 cm shave biopsy was obtained. The specimin was placed in preservative and sent for pathology. Hemostasis was achieved with a compression. Wound care was discussed with the patient. The patient was informed that it would be one to 2 weeks before the pathology will be interpreted. The shave biopsy was the new lesion that was seen approximately 1 inch to the left of the original lesion this lesion was shaved and sent for pathology.    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  We discussed wound care the patient's blood pressure is stable We discussed the patient's diabetes

## 2011-05-12 NOTE — Patient Instructions (Signed)
Leave the dressing in place until you've showered. Place a dab of Neosporin and a new bandage until a scab appears over the biopsy site. After the scab appears there is no need to keep a dressing in place unless the area can be irritated by clothing. Call our office if redness around the biopsy site appears and begins to spread more than a half inch from the site.  

## 2011-05-12 NOTE — Progress Notes (Signed)
Addended by: Willy Eddy on: 05/12/2011 04:06 PM   Modules accepted: Orders

## 2011-05-18 ENCOUNTER — Ambulatory Visit (HOSPITAL_COMMUNITY): Payer: BC Managed Care – PPO | Admitting: Physician Assistant

## 2011-05-20 DIAGNOSIS — M47812 Spondylosis without myelopathy or radiculopathy, cervical region: Secondary | ICD-10-CM | POA: Diagnosis not present

## 2011-06-03 DIAGNOSIS — M47812 Spondylosis without myelopathy or radiculopathy, cervical region: Secondary | ICD-10-CM | POA: Diagnosis not present

## 2011-06-17 DIAGNOSIS — M47812 Spondylosis without myelopathy or radiculopathy, cervical region: Secondary | ICD-10-CM | POA: Diagnosis not present

## 2011-06-23 DIAGNOSIS — E119 Type 2 diabetes mellitus without complications: Secondary | ICD-10-CM | POA: Diagnosis not present

## 2011-06-28 ENCOUNTER — Other Ambulatory Visit: Payer: Self-pay | Admitting: *Deleted

## 2011-06-28 MED ORDER — DICLOFENAC SODIUM 1 % TD GEL: 1.0000 "application " | TRANSDERMAL | Status: AC | PRN

## 2011-07-08 DIAGNOSIS — M47812 Spondylosis without myelopathy or radiculopathy, cervical region: Secondary | ICD-10-CM | POA: Diagnosis not present

## 2011-07-14 ENCOUNTER — Encounter (HOSPITAL_COMMUNITY): Payer: Self-pay | Admitting: Psychiatry

## 2011-07-14 ENCOUNTER — Ambulatory Visit (INDEPENDENT_AMBULATORY_CARE_PROVIDER_SITE_OTHER): Payer: Medicare Other | Admitting: Psychiatry

## 2011-07-14 DIAGNOSIS — F0630 Mood disorder due to known physiological condition, unspecified: Secondary | ICD-10-CM

## 2011-07-14 MED ORDER — DULOXETINE HCL 60 MG PO CPEP: 60.0000 mg | ORAL_CAPSULE | Freq: Every day | ORAL | Status: DC

## 2011-07-14 NOTE — Progress Notes (Signed)
Chief complaint Medication management and followup.  History of present illness Patient is a 56 year old Caucasian married man who came for his followup appointment with his wife.  He is been compliant with his Cymbalta .  He reported no side effects of medication.  He continues to have chronic pain due to extreme weather.  He was recently visited [place] area with his wife and experience cold weather .  However he is less anxious and requires only a few times Ativan.  He sleeps okay.  He continues to complain of poor memory , poor attention and concentration .  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  He denies any recent panic attack.  He was to continue his Cymbalta.  He denies any agitation anger or mood swings.  He denies any crying spells.  There were no tremors or shakes present.  He is taking pain medication and muscle relaxant for his physical illness.  He also takes melatonin for sleep.  Current psychiatric medication Cymbalta 60 mg daily Ativan 1 mg as needed .  Medical history Patient has history of gout which is controlled by diet, diffuse idiopathic skeletal hyperostosis, seasonal allergies, hyperlipidemia, chronic back pain, osteoporosis and hypertension.  He sees Dr. Lovell Sheehan.  He scheduled to have another comprehensive blood work and physical examination next month.  Mental status examination Patient is casually dressed and fairly groomed. He appears calm and cooperative.  His speech is slow with normal tone and volume. He is clear and coherent. He denies any active or passive suicidal thoughts or homicidal thoughts. His attention and concentration is fair. There no psychotic symptoms present. He's alert and oriented x3. He described his mood as down his affect is constricted. His insight judgment and pulse control is okay.  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  He still has leftover lorazepam which he takes as needed.  I explained risks and benefits of medication.  I recommend to call us if he has any question or concern or worsening of the symptoms otherwise I will see him again in 3 months.  He is scheduled to have blood work next month which we will reviewed on his next appointment.

## 2011-07-29 DIAGNOSIS — M47812 Spondylosis without myelopathy or radiculopathy, cervical region: Secondary | ICD-10-CM | POA: Diagnosis not present

## 2011-08-10 ENCOUNTER — Encounter: Payer: Self-pay | Admitting: Internal Medicine

## 2011-08-10 ENCOUNTER — Ambulatory Visit (INDEPENDENT_AMBULATORY_CARE_PROVIDER_SITE_OTHER): Payer: Medicare Other | Admitting: Internal Medicine

## 2011-08-10 VITALS — BP 142/80 | HR 84 | Temp 98.2°F | Resp 16 | Ht 70.0 in | Wt 270.0 lb

## 2011-08-10 DIAGNOSIS — E559 Vitamin D deficiency, unspecified: Secondary | ICD-10-CM

## 2011-08-10 DIAGNOSIS — E785 Hyperlipidemia, unspecified: Secondary | ICD-10-CM

## 2011-08-10 DIAGNOSIS — N139 Obstructive and reflux uropathy, unspecified: Secondary | ICD-10-CM

## 2011-08-10 DIAGNOSIS — K219 Gastro-esophageal reflux disease without esophagitis: Secondary | ICD-10-CM

## 2011-08-10 DIAGNOSIS — I10 Essential (primary) hypertension: Secondary | ICD-10-CM | POA: Diagnosis not present

## 2011-08-10 DIAGNOSIS — N401 Enlarged prostate with lower urinary tract symptoms: Secondary | ICD-10-CM

## 2011-08-10 DIAGNOSIS — Z0000 Encounter for general adult medical examination without abnormal findings: Secondary | ICD-10-CM | POA: Diagnosis not present

## 2011-08-10 DIAGNOSIS — Z23 Encounter for immunization: Secondary | ICD-10-CM | POA: Diagnosis not present

## 2011-08-10 DIAGNOSIS — E349 Endocrine disorder, unspecified: Secondary | ICD-10-CM | POA: Diagnosis not present

## 2011-08-10 DIAGNOSIS — N138 Other obstructive and reflux uropathy: Secondary | ICD-10-CM

## 2011-08-10 DIAGNOSIS — J309 Allergic rhinitis, unspecified: Secondary | ICD-10-CM | POA: Diagnosis not present

## 2011-08-10 DIAGNOSIS — E668 Other obesity: Secondary | ICD-10-CM

## 2011-08-10 DIAGNOSIS — T887XXA Unspecified adverse effect of drug or medicament, initial encounter: Secondary | ICD-10-CM | POA: Diagnosis not present

## 2011-08-10 LAB — BASIC METABOLIC PANEL
BUN: 16 mg/dL (ref 6–23)
CO2: 30 mEq/L (ref 19–32)
Chloride: 101 mEq/L (ref 96–112)
Creatinine, Ser: 0.7 mg/dL (ref 0.4–1.5)
Glucose, Bld: 174 mg/dL — ABNORMAL HIGH (ref 70–99)
Potassium: 4.6 mEq/L (ref 3.5–5.1)

## 2011-08-10 LAB — HEPATIC FUNCTION PANEL
ALT: 32 U/L (ref 0–53)
AST: 21 U/L (ref 0–37)
Albumin: 4 g/dL (ref 3.5–5.2)
Total Protein: 7.3 g/dL (ref 6.0–8.3)

## 2011-08-10 LAB — CBC WITH DIFFERENTIAL/PLATELET
Basophils Absolute: 0 10*3/uL (ref 0.0–0.1)
Basophils Relative: 0.5 % (ref 0.0–3.0)
Eosinophils Absolute: 0.2 10*3/uL (ref 0.0–0.7)
HCT: 47.7 % (ref 39.0–52.0)
Hemoglobin: 16.2 g/dL (ref 13.0–17.0)
Lymphocytes Relative: 20.2 % (ref 12.0–46.0)
Lymphs Abs: 1.8 10*3/uL (ref 0.7–4.0)
MCHC: 34 g/dL (ref 30.0–36.0)
Neutro Abs: 6.2 10*3/uL (ref 1.4–7.7)
RBC: 5.98 Mil/uL — ABNORMAL HIGH (ref 4.22–5.81)
RDW: 15 % — ABNORMAL HIGH (ref 11.5–14.6)

## 2011-08-10 LAB — LIPID PANEL
Cholesterol: 225 mg/dL — ABNORMAL HIGH (ref 0–200)
Total CHOL/HDL Ratio: 6
Triglycerides: 175 mg/dL — ABNORMAL HIGH (ref 0.0–149.0)

## 2011-08-10 MED ORDER — CELECOXIB 200 MG PO CAPS: 200.0000 mg | ORAL_CAPSULE | Freq: Two times a day (BID) | ORAL | Status: DC

## 2011-08-10 MED ORDER — FLUTICASONE PROPIONATE 50 MCG/ACT NA SUSP: 2.0000 | Freq: Every day | NASAL | Status: DC

## 2011-08-10 MED ORDER — ESOMEPRAZOLE MAGNESIUM 40 MG PO CPDR: 40.0000 mg | DELAYED_RELEASE_CAPSULE | Freq: Every day | ORAL | Status: DC

## 2011-08-10 MED ORDER — CANDESARTAN CILEXETIL 32 MG PO TABS: 32.0000 mg | ORAL_TABLET | Freq: Every day | ORAL | Status: DC

## 2011-08-10 NOTE — Progress Notes (Signed)
Subjective:    Patient ID: Vincent [name], male    DOB: 09-15-1955, 56 y.o.   MRN: [id number]  HPI The patient is a 56 year old white male who presents for his welcome to Medicare examination in addition he is followed for diabetes with a recent noted increase in CBGs was probably due to weight gain and dietary issues  He lists multiple family events and holidays as it apparently dietary control.  His also seen for monitoring of his hyperlipidemia for a history of gout And a history of diffuse idiopathic skeletal hyperostosis.  There presents with chronic musculoskeletal and neck pain. Increased pain noted     Review of Systems  Constitutional: Negative for fever and fatigue.  HENT: Negative for hearing loss, congestion, neck pain and postnasal drip.   Eyes: Negative for discharge, redness and visual disturbance.  Respiratory: Negative for cough, shortness of breath and wheezing.   Cardiovascular: Negative for leg swelling.  Gastrointestinal: Negative for abdominal pain, constipation and abdominal distention.  Genitourinary: Negative for urgency and frequency.  Musculoskeletal: Negative for joint swelling and arthralgias.  Skin: Negative for color change and rash.  Neurological: Negative for weakness and light-headedness.  Hematological: Negative for adenopathy.  Psychiatric/Behavioral: Negative for behavioral problems.       Objective:   Physical Exam  Nursing note and vitals reviewed. Constitutional: He is oriented to person, place, and time. He appears well-developed and well-nourished.  HENT:  Head: Normocephalic and atraumatic.  Eyes: Conjunctivae are normal. Pupils are equal, round, and reactive to light.  Neck: Normal range of motion. Neck supple.  Cardiovascular: Normal rate and regular rhythm.   Pulmonary/Chest: Effort normal and breath sounds normal.  Abdominal: Soft. Bowel sounds are normal.  Musculoskeletal: Normal range of motion.  Neurological: He is  alert and oriented to person, place, and time.  Skin: Skin is warm and dry.          Assessment & Plan:  Follow up for chronic medical problems DM  Monitor a1c for increased noted in his CBG's monitor testosterone Test lipids monitor uric acid for gout control Subjective:    Vincent [name] is a 56 y.o. male who presents for a welcome to Medicare exam.   Cardiac risk factors: hypertension, male gender, obesity (BMI >= 30 kg/m2) and sedentary lifestyle.  Depression Screen (Note: if answer to either of the following is "Yes", a more complete depression screening is indicated)  Q1: Over the past two weeks, have you felt down, depressed or hopeless? no Q2: Over the past two weeks, have you felt little interest or pleasure in doing things? no  Activities of Daily Living In your present state of health, do you have any difficulty performing the following activities?:  Preparing food and eating?: No Bathing yourself: No Getting dressed: No Using the toilet:No Moving around from place to place: No In the past year have you fallen or had a near fall?:No  Current exercise habits: Exercise is limited by orthopedic condition(s): dish.  Dietary issues discussed: OBESITY  Hearing difficulties: No Safe in current home environment: yes  The following portions of the patient's history were reviewed and updated as appropriate: allergies, current medications, past family history, past medical history, past social history, past surgical history and problem list. Review of Systems A comprehensive review of systems was negative.    Objective:     Vision by Snellen chart: right eye:20/20, left eye:20/20 Blood pressure 142/80, pulse 84, temperature 98.2 F (36.8 C), resp. rate 16, height 5'  10" (1.778 m), weight 270 lb (122.471 kg). Body mass index is 38.74 kg/(m^2). BP 142/80  Pulse 84  Temp 98.2 F (36.8 C)  Resp 16  Ht 5\' 10"  (1.778 m)  Wt 270 lb (122.471 kg)  BMI 38.74  kg/m2  [name] Appearance:    Alert, cooperative, no distress, appears stated age  Head:    Normocephalic, without obvious abnormality, atraumatic  Eyes:    PERRL, conjunctiva/corneas clear, EOM's intact, fundi    benign, both eyes       Ears:    Normal TM's and external ear canals, both ears  Nose:   Nares normal, septum midline, mucosa normal, no drainage    or sinus tenderness  Throat:   Lips, mucosa, and tongue normal; teeth and gums normal  Neck:   Supple, symmetrical, trachea midline, no adenopathy;       thyroid:  No enlargement/tenderness/nodules; no carotid   bruit or JVD  Back:     Symmetric, no curvature, ROM normal, no CVA tenderness  Lungs:     Clear to auscultation bilaterally, respirations unlabored  Chest wall:    No tenderness or deformity  Heart:    Regular rate and rhythm, S1 and S2 normal, no murmur, rub   or gallop  Abdomen:     Soft, non-tender, bowel sounds active all four quadrants,    no masses, no organomegaly  Genitalia:    Normal male without lesion, discharge or tenderness  Rectal:    Normal tone, normal prostate, no masses or tenderness;   guaiac negative stool  Extremities:   Extremities normal, atraumatic, no cyanosis or edema  Pulses:   2+ and symmetric all extremities  Skin:   Skin color, texture, turgor normal, no rashes or lesions  Lymph nodes:   Cervical, supraclavicular, and axillary nodes normal  Neurologic:   CNII-XII intact. Normal strength, sensation and reflexes      throughout      Assessment:     Patient presents for yearly preventative medicine examination.   all immunizations and health maintenance protocols were reviewed with the patient and they are up to date with these protocols.   screening laboratory values were reviewed with the patient including screening of hyperlipidemia PSA renal function and hepatic function.   There medications past medical history social history problem list and allergies were reviewed in detail.    Goals were established with regard to weight loss exercise diet in compliance with medications      Plan:     During the course of the visit the patient was educated and counseled about appropriate screening and preventive services including:   Influenza vaccine  Td vaccine  Prostate cancer screening  Nutrition counseling   Patient Instructions (the written plan) was given to the patient.

## 2011-08-11 LAB — LDL CHOLESTEROL, DIRECT: Direct LDL: 174 mg/dL

## 2011-08-11 LAB — VITAMIN D 25 HYDROXY (VIT D DEFICIENCY, FRACTURES): Vit D, 25-Hydroxy: 29 ng/mL — ABNORMAL LOW (ref 30–89)

## 2011-08-12 DIAGNOSIS — M47812 Spondylosis without myelopathy or radiculopathy, cervical region: Secondary | ICD-10-CM | POA: Diagnosis not present

## 2011-08-18 ENCOUNTER — Encounter: Payer: Medicare Other | Admitting: Internal Medicine

## 2011-08-26 DIAGNOSIS — M47812 Spondylosis without myelopathy or radiculopathy, cervical region: Secondary | ICD-10-CM | POA: Diagnosis not present

## 2011-09-16 DIAGNOSIS — M47812 Spondylosis without myelopathy or radiculopathy, cervical region: Secondary | ICD-10-CM | POA: Diagnosis not present

## 2011-09-22 ENCOUNTER — Ambulatory Visit (HOSPITAL_COMMUNITY): Payer: Self-pay | Admitting: Psychiatry

## 2011-10-07 DIAGNOSIS — M47812 Spondylosis without myelopathy or radiculopathy, cervical region: Secondary | ICD-10-CM | POA: Diagnosis not present

## 2011-10-21 DIAGNOSIS — M47812 Spondylosis without myelopathy or radiculopathy, cervical region: Secondary | ICD-10-CM | POA: Diagnosis not present

## 2011-11-02 ENCOUNTER — Other Ambulatory Visit: Payer: Self-pay | Admitting: *Deleted

## 2011-11-02 DIAGNOSIS — K219 Gastro-esophageal reflux disease without esophagitis: Secondary | ICD-10-CM

## 2011-11-02 MED ORDER — ESOMEPRAZOLE MAGNESIUM 40 MG PO CPDR: 40.0000 mg | DELAYED_RELEASE_CAPSULE | Freq: Every day | ORAL | Status: DC

## 2011-11-03 ENCOUNTER — Ambulatory Visit (INDEPENDENT_AMBULATORY_CARE_PROVIDER_SITE_OTHER): Payer: Medicare Other | Admitting: Psychiatry

## 2011-11-03 ENCOUNTER — Encounter (HOSPITAL_COMMUNITY): Payer: Self-pay | Admitting: Psychiatry

## 2011-11-03 VITALS — BP 136/88 | HR 84 | Wt 269.2 lb

## 2011-11-03 DIAGNOSIS — F0630 Mood disorder due to known physiological condition, unspecified: Secondary | ICD-10-CM

## 2011-11-03 MED ORDER — ATIVAN 1 MG PO TABS: 1.0000 mg | ORAL_TABLET | Freq: Every evening | ORAL | Status: AC | PRN

## 2011-11-03 MED ORDER — DULOXETINE HCL 60 MG PO CPEP: 60.0000 mg | ORAL_CAPSULE | Freq: Every day | ORAL | Status: DC

## 2011-11-03 NOTE — Progress Notes (Signed)
Chief complaint I am under stress.  My mother-in-law is a nursing home .  She fell and broke her head.  She has dementia.    History of present illness Patient is a 56 year old Caucasian married man who came for his followup appointment.  Patient endorse increased stress and anxiety in past few weeks.  Apparently his mother along who was living close to their home started to decline in her memory.  She was diagnosed with dementia and nursing home where she fell and broke her hip.  She has a history and she is now doing rehabilitation.  Patient endorse increased anxiety and stress at home as wife is very busy taking care of her mom.  Patient also endorse increase pain due to heavy rain and past few weeks.  He is taking lorazepam more often for his anxiety and sleep.  However he denies any agitation anger mood swing or any suicidal thoughts.  He admitted racing thoughts at night and feel very anxious and nervous about the future.  He continues to have poor attention poor concentration which she believe due to his pain level.  He is taking multiple pain medication.  He's not drinking or using any illegal substance.   Current psychiatric medication Cymbalta 60 mg daily Ativan 1 mg as needed .  Past psychiatric history Patient denies any previous history of psychiatric inpatient treatment or any suicidal attempt.  He has significant history of anxiety and depression mostly due to chronic pain.  He has taken Neurontin and Paxil in the past but he had a good response with Cymbalta.  He has a history of paranoia and hallucination or agitation.  Medical history Patient has history of gout which is controlled by diet, diffuse idiopathic skeletal hyperostosis, seasonal allergies, hyperlipidemia, chronic back pain, osteoporosis and hypertension.  He sees Dr. Lovell Sheehan.  He is a blood work on Aug 10 2011 which shows hypoglycemia however his liver function test were normal.  Mental status examination Patient is  casually dressed and fairly groomed. He appears anxious and nervous but cooperative.  His speech is slow with normal tone and volume.  His thought processes logical and goal-directed. He denies any active or passive suicidal thoughts or homicidal thoughts. His attention and concentration is fair. There no psychotic symptoms present. He's alert and oriented x3. He described his mood as down his affect is constricted. His insight judgment and pulse control is okay.  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  He will require a new position of Ativan , he will continue Cymbalta at present does.  I explained the risk and benefits of medication and recommended to call us if he is a question or concern or if he feel worsening of the symptoms.  Time spent 30 minutes.  I will see him again in 3 months.

## 2011-11-18 DIAGNOSIS — M47812 Spondylosis without myelopathy or radiculopathy, cervical region: Secondary | ICD-10-CM | POA: Diagnosis not present

## 2011-12-02 DIAGNOSIS — M47812 Spondylosis without myelopathy or radiculopathy, cervical region: Secondary | ICD-10-CM | POA: Diagnosis not present

## 2011-12-29 ENCOUNTER — Ambulatory Visit: Payer: Medicare Other | Admitting: Internal Medicine

## 2012-01-13 DIAGNOSIS — M47812 Spondylosis without myelopathy or radiculopathy, cervical region: Secondary | ICD-10-CM | POA: Diagnosis not present

## 2012-01-27 DIAGNOSIS — M47812 Spondylosis without myelopathy or radiculopathy, cervical region: Secondary | ICD-10-CM | POA: Diagnosis not present

## 2012-02-02 ENCOUNTER — Ambulatory Visit (INDEPENDENT_AMBULATORY_CARE_PROVIDER_SITE_OTHER): Payer: Medicare Other | Admitting: Psychiatry

## 2012-02-02 ENCOUNTER — Encounter (HOSPITAL_COMMUNITY): Payer: Self-pay | Admitting: Psychiatry

## 2012-02-02 DIAGNOSIS — F0630 Mood disorder due to known physiological condition, unspecified: Secondary | ICD-10-CM | POA: Diagnosis not present

## 2012-02-02 MED ORDER — DULOXETINE HCL 60 MG PO CPEP: 60.0000 mg | ORAL_CAPSULE | Freq: Every day | ORAL | Status: DC

## 2012-02-02 NOTE — Progress Notes (Signed)
Chief complaint My mother-in-law died in 01-08-23.  My life his going through grief.    History of present illness Patient is a 56 year old Caucasian married man who came for his followup appointment.  Patient told his mother-in-law died in 01-08-23 of 2 her fall.  Patient's wife has a difficult time but she is getting better now.  Patient is compliant with the medication and denies any side effects.  Usually rainy days he is more in pain.  He is not taking tramadol but still taking other pain medication.  He is sleeping on and off denies any recent panic attack agitation anger severe mood swing.  He was feeling depressed and taking more Ativan when his mother-in-law died but now he is back to taking Ativan only as needed.  Patient is talking with his wife about the places.  Couple wants to retired.  They're thinking about [place] however patient wife is still working.  Patient overall doing better.  His attention and concentration is better.  He's not drinking or using any illegal substance.    Current psychiatric medication Cymbalta 60 mg daily Ativan 1 mg as needed .  Past psychiatric history Patient denies any previous history of psychiatric inpatient treatment or any suicidal attempt.  He has significant history of anxiety and depression mostly due to chronic pain.  He has taken Neurontin and Paxil in the past but he had a good response with Cymbalta.  He denies any history of paranoia and hallucination or agitation.  Medical history Patient has history of gout which is controlled by diet, diffuse idiopathic skeletal hyperostosis, seasonal allergies, hyperlipidemia, chronic back pain, osteoporosis and hypertension.  He sees Dr. Lovell Sheehan.  He scheduled to have blood work in January.    Mental status examination Patient is casually dressed and fairly groomed. He appears anxious.  He is cooperative and maintained good eye contact.  His speech is slow with normal tone and volume.  His thought  processes logical and goal-directed. He denies any active or passive suicidal thoughts or homicidal thoughts. His attention and concentration is fair. There no psychotic symptoms present. He's alert and oriented x3. He described his mood as down his affect is constricted. His insight judgment and pulse control is okay.  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  Reassurance given.  I recommend to call us if he is any question or concern about the medication if he have any worsening of symptoms.  I will see him again in 3 months.

## 2012-02-08 ENCOUNTER — Other Ambulatory Visit: Payer: Self-pay | Admitting: *Deleted

## 2012-02-08 MED ORDER — ELETRIPTAN HYDROBROMIDE 40 MG PO TABS: 40.0000 mg | ORAL_TABLET | ORAL | Status: DC | PRN

## 2012-02-10 DIAGNOSIS — M47812 Spondylosis without myelopathy or radiculopathy, cervical region: Secondary | ICD-10-CM | POA: Diagnosis not present

## 2012-03-02 ENCOUNTER — Ambulatory Visit (INDEPENDENT_AMBULATORY_CARE_PROVIDER_SITE_OTHER): Payer: Medicare Other | Admitting: Internal Medicine

## 2012-03-02 VITALS — BP 144/90 | HR 84 | Temp 98.3°F | Resp 18 | Ht 70.0 in | Wt 266.0 lb

## 2012-03-02 DIAGNOSIS — E1165 Type 2 diabetes mellitus with hyperglycemia: Secondary | ICD-10-CM

## 2012-03-02 DIAGNOSIS — E785 Hyperlipidemia, unspecified: Secondary | ICD-10-CM | POA: Diagnosis not present

## 2012-03-02 DIAGNOSIS — M47812 Spondylosis without myelopathy or radiculopathy, cervical region: Secondary | ICD-10-CM | POA: Diagnosis not present

## 2012-03-02 DIAGNOSIS — M4810 Ankylosing hyperostosis [Forestier], site unspecified: Secondary | ICD-10-CM

## 2012-03-02 DIAGNOSIS — M533 Sacrococcygeal disorders, not elsewhere classified: Secondary | ICD-10-CM | POA: Diagnosis not present

## 2012-03-02 DIAGNOSIS — Z23 Encounter for immunization: Secondary | ICD-10-CM

## 2012-03-02 DIAGNOSIS — E1169 Type 2 diabetes mellitus with other specified complication: Secondary | ICD-10-CM | POA: Diagnosis not present

## 2012-03-02 DIAGNOSIS — M545 Low back pain: Secondary | ICD-10-CM

## 2012-03-02 LAB — HEPATIC FUNCTION PANEL
ALT: 33 U/L (ref 0–53)
Albumin: 4.1 g/dL (ref 3.5–5.2)
Alkaline Phosphatase: 97 U/L (ref 39–117)
Total Protein: 7.5 g/dL (ref 6.0–8.3)

## 2012-03-02 LAB — BASIC METABOLIC PANEL
BUN: 23 mg/dL (ref 6–23)
CO2: 26 mEq/L (ref 19–32)
Chloride: 99 mEq/L (ref 96–112)
Glucose, Bld: 354 mg/dL — ABNORMAL HIGH (ref 70–99)
Potassium: 4.8 mEq/L (ref 3.5–5.1)

## 2012-03-02 LAB — LIPID PANEL: HDL: 26.8 mg/dL — ABNORMAL LOW (ref >39.00)

## 2012-03-02 MED ORDER — MORPHINE SULFATE 15 MG PO TABS: 15.0000 mg | ORAL_TABLET | ORAL | Status: DC | PRN

## 2012-03-02 MED ORDER — BUTALBITAL-APAP-CAFFEINE 50-325-40 MG PO TABS: 1.0000 | ORAL_TABLET | Freq: Two times a day (BID) | ORAL | Status: AC | PRN

## 2012-03-02 MED ORDER — TAPENTADOL HCL 75 MG PO TABS: 75.0000 mg | ORAL_TABLET | Freq: Two times a day (BID) | ORAL | Status: DC

## 2012-03-02 NOTE — Progress Notes (Signed)
  Subjective:    Patient ID: Vincent [name], male    DOB: [birth year], 56 y.o.   MRN: [id number]  HPI Poor pain management Weather changes influence the DISH pain The pain meds are not holding him    Review of Systems  Constitutional:       Obesity  Respiratory: Negative.   Cardiovascular: Negative.   Musculoskeletal: Positive for myalgias, joint swelling and arthralgias.       Objective:   Physical Exam  Nursing note and vitals reviewed. Constitutional: He appears well-developed and well-nourished.       obese  Pulmonary/Chest: Effort normal.          Assessment & Plan:  DISH with significant pain Adjust medications reviewed need for weight management

## 2012-03-16 DIAGNOSIS — M47812 Spondylosis without myelopathy or radiculopathy, cervical region: Secondary | ICD-10-CM | POA: Diagnosis not present

## 2012-03-27 ENCOUNTER — Other Ambulatory Visit: Payer: Self-pay | Admitting: *Deleted

## 2012-03-27 MED ORDER — METFORMIN HCL 500 MG PO TABS: 500.0000 mg | ORAL_TABLET | Freq: Two times a day (BID) | ORAL | Status: DC

## 2012-03-30 DIAGNOSIS — M47812 Spondylosis without myelopathy or radiculopathy, cervical region: Secondary | ICD-10-CM | POA: Diagnosis not present

## 2012-04-09 ENCOUNTER — Encounter: Payer: Self-pay | Admitting: Internal Medicine

## 2012-04-13 DIAGNOSIS — M47812 Spondylosis without myelopathy or radiculopathy, cervical region: Secondary | ICD-10-CM | POA: Diagnosis not present

## 2012-04-27 DIAGNOSIS — M47812 Spondylosis without myelopathy or radiculopathy, cervical region: Secondary | ICD-10-CM | POA: Diagnosis not present

## 2012-05-04 ENCOUNTER — Encounter (HOSPITAL_COMMUNITY): Payer: Self-pay | Admitting: Psychiatry

## 2012-05-04 ENCOUNTER — Ambulatory Visit (INDEPENDENT_AMBULATORY_CARE_PROVIDER_SITE_OTHER): Payer: Medicare Other | Admitting: Psychiatry

## 2012-05-04 VITALS — BP 138/92 | HR 92 | Ht 71.0 in | Wt 267.0 lb

## 2012-05-04 DIAGNOSIS — F0630 Mood disorder due to known physiological condition, unspecified: Secondary | ICD-10-CM

## 2012-05-04 MED ORDER — DULOXETINE HCL 60 MG PO CPEP: 60.0000 mg | ORAL_CAPSULE | Freq: Every day | ORAL | Status: DC

## 2012-05-04 NOTE — Progress Notes (Signed)
Chief complaint Medication refill and followup.    History of present illness Patient is a 57 year old Caucasian married man who came for his followup appointment.  Patient is compliant with Cymbalta denies any side effects.  He had cut down his Ativan and takes only if he has severe anxiety.  He still has refill remaining.  Patient continued to endorse chronic pain and sometime insomnia and do to pain.  He's taking multiple pain medication .  He feels Cymbalta is working .  He denies any agitation anger mood swing.  He denies any crying spells or any irritability.  His level of energy is same.  This summer he is visiting [place] with his wife to explore retirement facility.  Patient also endorse that in next few weeks he and his wife is going for medication.  Patient's mother-in-law died a few months ago and he feel that his wife need some weight.  Patient is hoping that his wife make some decision about her retirement which can help them to make her future plan about [place].  Patient is not using any illegal substance.  He wants to come back after 6 months since she will be very busy traveling.     Current psychiatric medication Cymbalta 60 mg daily Ativan 1 mg as needed .  Past psychiatric history Patient denies any previous history of psychiatric inpatient treatment or any suicidal attempt.  He has significant history of anxiety and depression mostly due to chronic pain.  He has taken Neurontin and Paxil in the past but he had a good response with Cymbalta.  He denies any history of paranoia and hallucination or agitation.  Medical history Patient has history of gout which is controlled by diet, diffuse idiopathic skeletal hyperostosis, seasonal allergies, hyperlipidemia, chronic back pain, osteoporosis and hypertension.  He sees Dr. Lovell Sheehan.  He scheduled to have blood work in January.    Review of Systems  HENT: Positive for neck pain.   Musculoskeletal: Positive for back pain and joint  pain.  Psychiatric/Behavioral: Negative for suicidal ideas, hallucinations and substance abuse. The patient is nervous/anxious and has insomnia.     Filed Vitals:   05/04/12 1315  BP: 138/92  Pulse: 92   Mental status examination Patient is casually dressed and fairly groomed. He is calm and cooperative.  He maintained good eye contact.  His speech is slow with normal tone and volume.  His thought processes logical and goal-directed. He denies any active or passive suicidal thoughts or homicidal thoughts. His attention and concentration is fair.  He denies any auditory or visual hallucination.  His fund of knowledge is adequate. There no psychotic symptoms present. He's alert and oriented x3. He described his mood as down his affect is constricted. His insight judgment and pulse control is okay.  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  He has refill remaining on his Ativan and does not require any medication.  I explained risks and benefits of medication.  I recommend to call us if his any question or concern if he feel worsening of the symptom.  I will see him again in 6 months.  Portion of this note is generated with voice dictation software and may contain typographical error.

## 2012-05-18 DIAGNOSIS — M47812 Spondylosis without myelopathy or radiculopathy, cervical region: Secondary | ICD-10-CM | POA: Diagnosis not present

## 2012-05-19 ENCOUNTER — Telehealth: Payer: Self-pay | Admitting: Internal Medicine

## 2012-05-19 NOTE — Telephone Encounter (Signed)
Pt's wife calling to request a call back from [facility] regarding pt's inability to tolerate metFORMIN (GLUCOPHAGE) 500 MG tablet.

## 2012-05-19 NOTE — Telephone Encounter (Signed)
Per dr Jola Schmidt bydureon once a week- sample given

## 2012-06-01 DIAGNOSIS — M47812 Spondylosis without myelopathy or radiculopathy, cervical region: Secondary | ICD-10-CM | POA: Diagnosis not present

## 2012-06-05 ENCOUNTER — Telehealth: Payer: Self-pay | Admitting: Internal Medicine

## 2012-06-05 MED ORDER — EXENATIDE ER 2 MG ~~LOC~~ SUSR: 2.0000 mg | SUBCUTANEOUS | Status: DC

## 2012-06-05 NOTE — Telephone Encounter (Signed)
Pt received samples of BYDUREON, 2mg  injectable, once per week. They would like a rx sent to [facility] order [facility]. QTY 12, for 3mos.  Call wife at work to let know when done: Annice Pih) [phone].

## 2012-06-05 NOTE — Telephone Encounter (Signed)
rx sent in electronically 

## 2012-06-15 DIAGNOSIS — M47812 Spondylosis without myelopathy or radiculopathy, cervical region: Secondary | ICD-10-CM | POA: Diagnosis not present

## 2012-06-27 ENCOUNTER — Telehealth (HOSPITAL_COMMUNITY): Payer: Self-pay | Admitting: *Deleted

## 2012-06-27 NOTE — Telephone Encounter (Signed)
See phone note

## 2012-06-28 ENCOUNTER — Telehealth (HOSPITAL_COMMUNITY): Payer: Self-pay | Admitting: *Deleted

## 2012-06-28 ENCOUNTER — Other Ambulatory Visit (HOSPITAL_COMMUNITY): Payer: Self-pay | Admitting: *Deleted

## 2012-06-28 ENCOUNTER — Other Ambulatory Visit (HOSPITAL_COMMUNITY): Payer: Self-pay | Admitting: Psychiatry

## 2012-06-28 DIAGNOSIS — F0630 Mood disorder due to known physiological condition, unspecified: Secondary | ICD-10-CM

## 2012-06-28 MED ORDER — DULOXETINE HCL 60 MG PO CPEP: 60.0000 mg | ORAL_CAPSULE | Freq: Every day | ORAL | Status: DC

## 2012-06-28 NOTE — Telephone Encounter (Signed)
See phone note

## 2012-06-28 NOTE — Telephone Encounter (Signed)
RX printed in error. Resent thru [facility] to [facility] pharmacy today

## 2012-06-28 NOTE — Telephone Encounter (Signed)
We will send brand name Cymbalta as patient not doing well with generic

## 2012-06-29 DIAGNOSIS — M7660 Achilles tendinitis, unspecified leg: Secondary | ICD-10-CM | POA: Diagnosis not present

## 2012-06-29 DIAGNOSIS — M19079 Primary osteoarthritis, unspecified ankle and foot: Secondary | ICD-10-CM | POA: Diagnosis not present

## 2012-06-29 DIAGNOSIS — M47812 Spondylosis without myelopathy or radiculopathy, cervical region: Secondary | ICD-10-CM | POA: Diagnosis not present

## 2012-07-05 ENCOUNTER — Encounter: Payer: Self-pay | Admitting: Internal Medicine

## 2012-07-05 ENCOUNTER — Ambulatory Visit (INDEPENDENT_AMBULATORY_CARE_PROVIDER_SITE_OTHER): Payer: Medicare Other | Admitting: Internal Medicine

## 2012-07-05 VITALS — BP 160/90 | HR 80 | Temp 98.2°F | Resp 18 | Ht 71.0 in | Wt 264.0 lb

## 2012-07-05 DIAGNOSIS — I10 Essential (primary) hypertension: Secondary | ICD-10-CM

## 2012-07-05 DIAGNOSIS — E785 Hyperlipidemia, unspecified: Secondary | ICD-10-CM

## 2012-07-05 DIAGNOSIS — M549 Dorsalgia, unspecified: Secondary | ICD-10-CM

## 2012-07-05 LAB — BASIC METABOLIC PANEL
GFR: 101.48 mL/min (ref >60.00)
Glucose, Bld: 173 mg/dL — ABNORMAL HIGH (ref 70–99)
Potassium: 5.1 mEq/L (ref 3.5–5.1)
Sodium: 136 mEq/L (ref 135–145)

## 2012-07-05 LAB — HEMOGLOBIN A1C: Hgb A1c MFr Bld: 7.7 % — ABNORMAL HIGH (ref 4.6–6.5)

## 2012-07-05 MED ORDER — CANDESARTAN CILEXETIL-HCTZ 32-12.5 MG PO TABS: 1.0000 | ORAL_TABLET | Freq: Every day | ORAL | Status: DC

## 2012-07-05 NOTE — Patient Instructions (Signed)
The patient is instructed to continue all medications as prescribed. Schedule followup with check out clerk upon leaving the clinic  

## 2012-07-05 NOTE — Progress Notes (Signed)
Subjective:    Patient ID: Vincent [name], male    DOB: 12/26/1955, 57 y.o.   MRN: [id number]  HPI  Has continued to loose weight slowly Back and neck  pain from dish with flairs Blood pressure elevation with pain Elevation in blood pressure with byduron shots?   Review of Systems  Constitutional: Negative for fever and fatigue.  HENT: Negative for hearing loss, congestion, neck pain and postnasal drip.   Eyes: Negative for discharge, redness and visual disturbance.  Respiratory: Negative for cough, shortness of breath and wheezing.   Cardiovascular: Negative for leg swelling.  Gastrointestinal: Negative for abdominal pain, constipation and abdominal distention.  Genitourinary: Negative for urgency and frequency.  Musculoskeletal: Positive for myalgias, joint swelling and gait problem. Negative for arthralgias.  Skin: Negative for color change and rash.  Neurological: Negative for weakness and light-headedness.  Hematological: Negative for adenopathy.  Psychiatric/Behavioral: Negative for behavioral problems.   Past Medical History  Diagnosis Date  . Anxiety   . Depression   . Gout   . DISH (diffuse idiopathic skeletal hyperostosis)   . Seasonal allergies   . Hyperlipidemia     History   Social History  . Marital Status: Married    Spouse Name: N/A    Number of Children: N/A  . Years of Education: N/A   Occupational History  . Not on file.   Social History Main Topics  . Smoking status: Never Smoker   . Smokeless tobacco: Not on file  . Alcohol Use: Yes  . Drug Use: No  . Sexually Active: Yes   Other Topics Concern  . Not on file   Social History Narrative  . No narrative on file    No past surgical history on file.  Family History  Problem Relation Age of Onset  . Hypertension Mother   . Cancer Mother     uterine and colon cancers  . Hypertension Sister   . Hypertension Maternal Grandmother   . Heart disease Father     No Known  Allergies  Current Outpatient Prescriptions on File Prior to Visit  Medication Sig Dispense Refill  . ATIVAN 1 MG tablet Take 1 tablet (1 mg total) by mouth at bedtime as needed for anxiety.  30 tablet  1  . azelastine (ASTELIN) 137 MCG/SPRAY nasal spray Place 1 spray into the nose as needed. Use in each nostril as directed  90 mL  3  . baclofen (LIORESAL) 10 MG tablet Take 10 mg by mouth 3 (three) times daily.        . butalbital-acetaminophen-caffeine (ESGIC) 50-325-40 MG per tablet Take 1 tablet by mouth 2 (two) times daily as needed for pain or headache.  60 tablet  0  . Butenafine HCl 1 % cream Apply topically 2 (two) times daily.      . candesartan (ATACAND) 32 MG tablet Take 1 tablet (32 mg total) by mouth daily.  90 tablet  3  . celecoxib (CELEBREX) 200 MG capsule Take 1 capsule (200 mg total) by mouth 2 (two) times daily.  180 capsule  3  . Cholecalciferol 50000 UNITS capsule Take 50,000 Units by mouth 2 (two) times a week.        . cyclobenzaprine (FLEXERIL) 10 MG tablet Take 10 mg by mouth 3 (three) times daily as needed.        . diclofenac sodium (VOLTAREN) 1 % GEL Apply 1 application topically as needed.  300 g  3  . DULoxetine (CYMBALTA) 60 MG  capsule Take 1 capsule (60 mg total) by mouth daily.  90 capsule  0  . eletriptan (RELPAX) 40 MG tablet One tablet by mouth at onset of headache. May repeat in 2 hours if headache persists or recurs. may repeat in 2 hours if necessary  10 tablet  5  . eletriptan (RELPAX) 40 MG tablet One tablet by mouth at onset of headache. May repeat in 2 hours if headache persists or recurs.      Marland Kitchen esomeprazole (NEXIUM) 40 MG capsule Take 1 capsule (40 mg total) by mouth daily before breakfast.  90 capsule  3  . Exenatide (BYDUREON) 2 MG SUSR Inject 2 mg into the skin once a week.  4 each  3  . fluticasone (FLONASE) 50 MCG/ACT nasal spray Place 2 sprays into the nose daily. 2 sprays each nostril daily  48 g  3  . GLUCOCOM LANCETS 33G MISC 1 each by Does  not apply route 2 (two) times daily before a meal.  200 each  3  . KRILL OIL 1000 MG CAPS Take by mouth 2 (two) times daily.        . Melatonin 3 MG CAPS Take by mouth at bedtime as needed.        Marland Kitchen morphine (MSIR) 15 MG tablet Take 1 tablet (15 mg total) by mouth every 4 (four) hours as needed for pain (breakthrough).  30 tablet  0  . NON FORMULARY Allergy Injections       . NON FORMULARY allrex- 1 two times a day       . tadalafil (CIALIS) 20 MG tablet Take 20 mg by mouth daily as needed.        . Tapentadol HCl 75 MG TABS Take 1 tablet (75 mg total) by mouth 2 (two) times daily.  180 tablet  0   No current facility-administered medications on file prior to visit.    BP 160/90  Pulse 80  Temp(Src) 98.2 F (36.8 C)  Resp 18  Ht 5\' 11"  (1.803 m)  Wt 264 lb (119.75 kg)  BMI 36.84 kg/m2       Objective:   Physical Exam  Vitals reviewed. Constitutional: He appears well-developed and well-nourished.  HENT:  Head: Normocephalic and atraumatic.  Eyes: Conjunctivae are normal. Pupils are equal, round, and reactive to light.  Neck: Normal range of motion. Neck supple.  Cardiovascular: Normal rate and regular rhythm.   Murmur heard. Pulmonary/Chest: Effort normal and breath sounds normal.  Abdominal: Soft. Bowel sounds are normal.  Musculoskeletal: He exhibits edema and tenderness.          Assessment & Plan:  Mild depression on generic cymbalta and has changed back to name brand Blood pressure  Change to atacand hctz

## 2012-07-13 DIAGNOSIS — M47812 Spondylosis without myelopathy or radiculopathy, cervical region: Secondary | ICD-10-CM | POA: Diagnosis not present

## 2012-07-28 DIAGNOSIS — M47812 Spondylosis without myelopathy or radiculopathy, cervical region: Secondary | ICD-10-CM | POA: Diagnosis not present

## 2012-08-07 ENCOUNTER — Telehealth: Payer: Self-pay | Admitting: Internal Medicine

## 2012-08-07 MED ORDER — CELECOXIB 200 MG PO CAPS: 200.0000 mg | ORAL_CAPSULE | Freq: Two times a day (BID) | ORAL | Status: DC

## 2012-08-07 NOTE — Telephone Encounter (Signed)
This was done.

## 2012-08-07 NOTE — Telephone Encounter (Signed)
Pt's wife called in. Will you please call in Celebrex (mail order rx) to [facility].  3  Month refill 90 day supply. Please advise. Thank you.

## 2012-08-23 DIAGNOSIS — M47812 Spondylosis without myelopathy or radiculopathy, cervical region: Secondary | ICD-10-CM | POA: Diagnosis not present

## 2012-08-28 DIAGNOSIS — M7660 Achilles tendinitis, unspecified leg: Secondary | ICD-10-CM | POA: Diagnosis not present

## 2012-09-05 DIAGNOSIS — M47812 Spondylosis without myelopathy or radiculopathy, cervical region: Secondary | ICD-10-CM | POA: Diagnosis not present

## 2012-09-20 DIAGNOSIS — M47812 Spondylosis without myelopathy or radiculopathy, cervical region: Secondary | ICD-10-CM | POA: Diagnosis not present

## 2012-09-22 DIAGNOSIS — M47812 Spondylosis without myelopathy or radiculopathy, cervical region: Secondary | ICD-10-CM | POA: Diagnosis not present

## 2012-09-25 ENCOUNTER — Other Ambulatory Visit (HOSPITAL_COMMUNITY): Payer: Self-pay | Admitting: *Deleted

## 2012-09-25 DIAGNOSIS — F0630 Mood disorder due to known physiological condition, unspecified: Secondary | ICD-10-CM

## 2012-09-25 MED ORDER — DULOXETINE HCL 60 MG PO CPEP: 60.0000 mg | ORAL_CAPSULE | Freq: Every day | ORAL | Status: DC

## 2012-09-26 DIAGNOSIS — M7660 Achilles tendinitis, unspecified leg: Secondary | ICD-10-CM | POA: Diagnosis not present

## 2012-09-26 DIAGNOSIS — M47812 Spondylosis without myelopathy or radiculopathy, cervical region: Secondary | ICD-10-CM | POA: Diagnosis not present

## 2012-09-28 DIAGNOSIS — M7660 Achilles tendinitis, unspecified leg: Secondary | ICD-10-CM | POA: Diagnosis not present

## 2012-09-28 DIAGNOSIS — M47812 Spondylosis without myelopathy or radiculopathy, cervical region: Secondary | ICD-10-CM | POA: Diagnosis not present

## 2012-10-02 DIAGNOSIS — M7660 Achilles tendinitis, unspecified leg: Secondary | ICD-10-CM | POA: Diagnosis not present

## 2012-10-02 DIAGNOSIS — M47812 Spondylosis without myelopathy or radiculopathy, cervical region: Secondary | ICD-10-CM | POA: Diagnosis not present

## 2012-10-06 DIAGNOSIS — M7660 Achilles tendinitis, unspecified leg: Secondary | ICD-10-CM | POA: Diagnosis not present

## 2012-10-06 DIAGNOSIS — M47812 Spondylosis without myelopathy or radiculopathy, cervical region: Secondary | ICD-10-CM | POA: Diagnosis not present

## 2012-10-12 DIAGNOSIS — M7660 Achilles tendinitis, unspecified leg: Secondary | ICD-10-CM | POA: Diagnosis not present

## 2012-10-12 DIAGNOSIS — M47812 Spondylosis without myelopathy or radiculopathy, cervical region: Secondary | ICD-10-CM | POA: Diagnosis not present

## 2012-10-13 ENCOUNTER — Other Ambulatory Visit: Payer: Self-pay | Admitting: *Deleted

## 2012-10-13 DIAGNOSIS — J309 Allergic rhinitis, unspecified: Secondary | ICD-10-CM

## 2012-10-13 DIAGNOSIS — K219 Gastro-esophageal reflux disease without esophagitis: Secondary | ICD-10-CM

## 2012-10-13 MED ORDER — FLUTICASONE PROPIONATE 50 MCG/ACT NA SUSP: 2.0000 | Freq: Every day | NASAL | Status: DC

## 2012-10-13 MED ORDER — ESOMEPRAZOLE MAGNESIUM 40 MG PO CPDR: 40.0000 mg | DELAYED_RELEASE_CAPSULE | Freq: Every day | ORAL | Status: DC

## 2012-10-17 DIAGNOSIS — M7660 Achilles tendinitis, unspecified leg: Secondary | ICD-10-CM | POA: Diagnosis not present

## 2012-10-17 DIAGNOSIS — M47812 Spondylosis without myelopathy or radiculopathy, cervical region: Secondary | ICD-10-CM | POA: Diagnosis not present

## 2012-10-19 DIAGNOSIS — M7660 Achilles tendinitis, unspecified leg: Secondary | ICD-10-CM | POA: Diagnosis not present

## 2012-10-19 DIAGNOSIS — M47812 Spondylosis without myelopathy or radiculopathy, cervical region: Secondary | ICD-10-CM | POA: Diagnosis not present

## 2012-10-24 DIAGNOSIS — M47812 Spondylosis without myelopathy or radiculopathy, cervical region: Secondary | ICD-10-CM | POA: Diagnosis not present

## 2012-10-24 DIAGNOSIS — M7660 Achilles tendinitis, unspecified leg: Secondary | ICD-10-CM | POA: Diagnosis not present

## 2012-10-27 DIAGNOSIS — M7660 Achilles tendinitis, unspecified leg: Secondary | ICD-10-CM | POA: Diagnosis not present

## 2012-10-27 DIAGNOSIS — M47812 Spondylosis without myelopathy or radiculopathy, cervical region: Secondary | ICD-10-CM | POA: Diagnosis not present

## 2012-11-01 ENCOUNTER — Ambulatory Visit (HOSPITAL_COMMUNITY): Payer: Self-pay | Admitting: Psychiatry

## 2012-11-01 DIAGNOSIS — M47812 Spondylosis without myelopathy or radiculopathy, cervical region: Secondary | ICD-10-CM | POA: Diagnosis not present

## 2012-11-01 DIAGNOSIS — M7660 Achilles tendinitis, unspecified leg: Secondary | ICD-10-CM | POA: Diagnosis not present

## 2012-11-03 DIAGNOSIS — M47812 Spondylosis without myelopathy or radiculopathy, cervical region: Secondary | ICD-10-CM | POA: Diagnosis not present

## 2012-11-03 DIAGNOSIS — M7660 Achilles tendinitis, unspecified leg: Secondary | ICD-10-CM | POA: Diagnosis not present

## 2012-11-07 DIAGNOSIS — M47812 Spondylosis without myelopathy or radiculopathy, cervical region: Secondary | ICD-10-CM | POA: Diagnosis not present

## 2012-11-07 DIAGNOSIS — M7660 Achilles tendinitis, unspecified leg: Secondary | ICD-10-CM | POA: Diagnosis not present

## 2012-11-10 ENCOUNTER — Ambulatory Visit (INDEPENDENT_AMBULATORY_CARE_PROVIDER_SITE_OTHER): Payer: Medicare Other | Admitting: Internal Medicine

## 2012-11-10 ENCOUNTER — Encounter: Payer: Self-pay | Admitting: Internal Medicine

## 2012-11-10 VITALS — BP 144/86 | HR 68 | Temp 98.3°F | Resp 18 | Ht 71.0 in | Wt 270.0 lb

## 2012-11-10 DIAGNOSIS — M5137 Other intervertebral disc degeneration, lumbosacral region: Secondary | ICD-10-CM

## 2012-11-10 DIAGNOSIS — M7660 Achilles tendinitis, unspecified leg: Secondary | ICD-10-CM | POA: Diagnosis not present

## 2012-11-10 DIAGNOSIS — E785 Hyperlipidemia, unspecified: Secondary | ICD-10-CM | POA: Diagnosis not present

## 2012-11-10 DIAGNOSIS — M47812 Spondylosis without myelopathy or radiculopathy, cervical region: Secondary | ICD-10-CM | POA: Diagnosis not present

## 2012-11-10 DIAGNOSIS — M7662 Achilles tendinitis, left leg: Secondary | ICD-10-CM

## 2012-11-10 DIAGNOSIS — E1165 Type 2 diabetes mellitus with hyperglycemia: Secondary | ICD-10-CM

## 2012-11-10 LAB — LIPID PANEL
Cholesterol: 215 mg/dL — ABNORMAL HIGH (ref 0–200)
HDL: 28.3 mg/dL — ABNORMAL LOW (ref >39.00)
VLDL: 57.8 mg/dL — ABNORMAL HIGH (ref 0.0–40.0)

## 2012-11-10 MED ORDER — CANAGLIFLOZIN 100 MG PO TABS: 1.0000 | ORAL_TABLET | Freq: Every day | ORAL | Status: DC

## 2012-11-10 NOTE — Progress Notes (Signed)
Subjective:    Patient ID: Vincent [name], male    DOB: 02-29-1956, 57 y.o.   MRN: [id number]  HPI DISH with increased pain but controlled by the nuycenta Increased pain that the dry enviroment and higher altitiute Had a fascial pain Increased CBG's Consult with ortho ( foot center xray showed spurring)    Review of Systems  Constitutional: Negative for fever and fatigue.  HENT: Negative for hearing loss, congestion, neck pain and postnasal drip.   Eyes: Negative for discharge, redness and visual disturbance.  Respiratory: Negative for cough, shortness of breath and wheezing.   Cardiovascular: Negative for leg swelling.  Gastrointestinal: Negative for abdominal pain, constipation and abdominal distention.  Genitourinary: Negative for urgency and frequency.  Musculoskeletal: Negative for joint swelling and arthralgias.  Skin: Negative for color change and rash.  Neurological: Negative for weakness and light-headedness.  Hematological: Negative for adenopathy.  Psychiatric/Behavioral: Negative for behavioral problems.   Past Medical History  Diagnosis Date  . Anxiety   . Depression   . Gout   . DISH (diffuse idiopathic skeletal hyperostosis)   . Seasonal allergies   . Hyperlipidemia     History   Social History  . Marital Status: Married    Spouse Name: N/A    Number of Children: N/A  . Years of Education: N/A   Occupational History  . Not on file.   Social History Main Topics  . Smoking status: Never Smoker   . Smokeless tobacco: Not on file  . Alcohol Use: Yes  . Drug Use: No  . Sexual Activity: Yes   Other Topics Concern  . Not on file   Social History Narrative  . No narrative on file    History reviewed. No pertinent past surgical history.  Family History  Problem Relation Age of Onset  . Hypertension Mother   . Cancer Mother     uterine and colon cancers  . Hypertension Sister   . Hypertension Maternal Grandmother   . Heart disease Father      No Known Allergies  Current Outpatient Prescriptions on File Prior to Visit  Medication Sig Dispense Refill  . azelastine (ASTELIN) 137 MCG/SPRAY nasal spray Place 1 spray into the nose as needed. Use in each nostril as directed  90 mL  3  . baclofen (LIORESAL) 10 MG tablet Take 10 mg by mouth 3 (three) times daily.        . butalbital-acetaminophen-caffeine (ESGIC) 50-325-40 MG per tablet Take 1 tablet by mouth 2 (two) times daily as needed for pain or headache.  60 tablet  0  . Butenafine HCl 1 % cream Apply topically 2 (two) times daily.      . candesartan-hydrochlorothiazide (ATACAND HCT) 32-12.5 MG per tablet Take 1 tablet by mouth daily.  90 tablet  3  . celecoxib (CELEBREX) 200 MG capsule Take 1 capsule (200 mg total) by mouth 2 (two) times daily.  180 capsule  3  . Cholecalciferol 50000 UNITS capsule Take 50,000 Units by mouth 2 (two) times a week.        . cyclobenzaprine (FLEXERIL) 10 MG tablet Take 10 mg by mouth 3 (three) times daily as needed.        . diclofenac sodium (VOLTAREN) 1 % GEL Apply 1 application topically as needed.  300 g  3  . DULoxetine (CYMBALTA) 60 MG capsule Take 1 capsule (60 mg total) by mouth daily.  90 capsule  0  . eletriptan (RELPAX) 40 MG tablet One tablet  by mouth at onset of headache. May repeat in 2 hours if headache persists or recurs. may repeat in 2 hours if necessary  10 tablet  5  . esomeprazole (NEXIUM) 40 MG capsule Take 1 capsule (40 mg total) by mouth daily before breakfast.  90 capsule  3  . Exenatide (BYDUREON) 2 MG SUSR Inject 2 mg into the skin once a week.  4 each  3  . fluticasone (FLONASE) 50 MCG/ACT nasal spray Place 2 sprays into the nose daily. 2 sprays each nostril daily  48 g  3  . GLUCOCOM LANCETS 33G MISC 1 each by Does not apply route 2 (two) times daily before a meal.  200 each  3  . KRILL OIL 1000 MG CAPS Take by mouth 2 (two) times daily.        . Melatonin 3 MG CAPS Take by mouth at bedtime as needed.        Marland Kitchen morphine  (MSIR) 15 MG tablet Take 1 tablet (15 mg total) by mouth every 4 (four) hours as needed for pain (breakthrough).  30 tablet  0  . NON FORMULARY Allergy Injections       . NON FORMULARY allrex- 1 two times a day       . tadalafil (CIALIS) 20 MG tablet Take 20 mg by mouth daily as needed.        . Tapentadol HCl 75 MG TABS Take 1 tablet (75 mg total) by mouth 2 (two) times daily.  180 tablet  0  . traMADol (ULTRAM) 50 MG tablet Take 50 mg by mouth every 6 (six) hours as needed for pain.       No current facility-administered medications on file prior to visit.    BP 144/86  Pulse 68  Temp(Src) 98.3 F (36.8 C)  Resp 18  Ht 5\' 11"  (1.803 m)  Wt 270 lb (122.471 kg)  BMI 37.67 kg/m2       Objective:   Physical Exam  Constitutional: He appears well-developed and well-nourished.  obese  HENT:  Head: Normocephalic and atraumatic.  Eyes: Conjunctivae are normal. Pupils are equal, round, and reactive to light.  Neck: Normal range of motion. Neck supple.  Cardiovascular: Normal rate and regular rhythm.   Murmur heard. Pulmonary/Chest: Effort normal and breath sounds normal.  Abdominal: Soft. Bowel sounds are normal.  Musculoskeletal: He exhibits edema and tenderness.          Assessment & Plan:  Refer to ortho for Achilles spurs and tendinitis chronic Poorly controlled dM Monitor hemoglobin A1c today and consider changing from bydurion Trial of invokana

## 2012-11-11 ENCOUNTER — Emergency Department (HOSPITAL_COMMUNITY)
Admission: EM | Admit: 2012-11-11 | Discharge: 2012-11-11 | Disposition: A | Discharge status: Home / Self Care | Payer: Medicare Other | Attending: Emergency Medicine | Admitting: Emergency Medicine

## 2012-11-11 ENCOUNTER — Emergency Department (HOSPITAL_COMMUNITY): Payer: Medicare Other

## 2012-11-11 ENCOUNTER — Encounter (HOSPITAL_COMMUNITY): Payer: Self-pay | Admitting: Emergency Medicine

## 2012-11-11 DIAGNOSIS — N1330 Unspecified hydronephrosis: Secondary | ICD-10-CM | POA: Diagnosis not present

## 2012-11-11 DIAGNOSIS — N200 Calculus of kidney: Secondary | ICD-10-CM | POA: Diagnosis not present

## 2012-11-11 DIAGNOSIS — F411 Generalized anxiety disorder: Secondary | ICD-10-CM | POA: Insufficient documentation

## 2012-11-11 DIAGNOSIS — F3289 Other specified depressive episodes: Secondary | ICD-10-CM | POA: Insufficient documentation

## 2012-11-11 DIAGNOSIS — K5730 Diverticulosis of large intestine without perforation or abscess without bleeding: Secondary | ICD-10-CM | POA: Diagnosis not present

## 2012-11-11 DIAGNOSIS — Z79899 Other long term (current) drug therapy: Secondary | ICD-10-CM | POA: Insufficient documentation

## 2012-11-11 DIAGNOSIS — Z880 Allergy status to penicillin: Secondary | ICD-10-CM | POA: Insufficient documentation

## 2012-11-11 DIAGNOSIS — IMO0002 Reserved for concepts with insufficient information to code with codable children: Secondary | ICD-10-CM | POA: Insufficient documentation

## 2012-11-11 DIAGNOSIS — Z8739 Personal history of other diseases of the musculoskeletal system and connective tissue: Secondary | ICD-10-CM | POA: Diagnosis not present

## 2012-11-11 DIAGNOSIS — Z8639 Personal history of other endocrine, nutritional and metabolic disease: Secondary | ICD-10-CM | POA: Insufficient documentation

## 2012-11-11 DIAGNOSIS — Z791 Long term (current) use of non-steroidal anti-inflammatories (NSAID): Secondary | ICD-10-CM | POA: Insufficient documentation

## 2012-11-11 DIAGNOSIS — R109 Unspecified abdominal pain: Secondary | ICD-10-CM | POA: Diagnosis not present

## 2012-11-11 DIAGNOSIS — R110 Nausea: Secondary | ICD-10-CM | POA: Insufficient documentation

## 2012-11-11 DIAGNOSIS — F329 Major depressive disorder, single episode, unspecified: Secondary | ICD-10-CM | POA: Insufficient documentation

## 2012-11-11 DIAGNOSIS — Z862 Personal history of diseases of the blood and blood-forming organs and certain disorders involving the immune mechanism: Secondary | ICD-10-CM | POA: Insufficient documentation

## 2012-11-11 HISTORY — DX: Calculus of kidney: N20.0

## 2012-11-11 LAB — URINE MICROSCOPIC-ADD ON

## 2012-11-11 LAB — URINALYSIS, ROUTINE W REFLEX MICROSCOPIC
Glucose, UA: >1000 mg/dL — AB
Ketones, ur: 15 mg/dL — AB
Leukocytes, UA: NEGATIVE
Nitrite: NEGATIVE
Specific Gravity, Urine: 1.037 — ABNORMAL HIGH (ref 1.005–1.030)
pH: 5 (ref 5.0–8.0)

## 2012-11-11 MED ORDER — KETOROLAC TROMETHAMINE 10 MG PO TABS: 10.0000 mg | ORAL_TABLET | Freq: Four times a day (QID) | ORAL | Status: DC | PRN

## 2012-11-11 MED ORDER — ONDANSETRON HCL 4 MG/2ML IJ SOLN
4.0000 mg | Freq: Once | INTRAMUSCULAR | Status: AC
  Administered 2012-11-11: 4 mg via INTRAVENOUS
  Filled 2012-11-11: qty 2

## 2012-11-11 MED ORDER — KETOROLAC TROMETHAMINE 30 MG/ML IJ SOLN
30.0000 mg | Freq: Once | INTRAMUSCULAR | Status: AC
  Administered 2012-11-11: 30 mg via INTRAVENOUS
  Filled 2012-11-11: qty 1

## 2012-11-11 MED ORDER — HYDROMORPHONE HCL PF 1 MG/ML IJ SOLN
1.0000 mg | Freq: Once | INTRAMUSCULAR | Status: AC
  Administered 2012-11-11: 1 mg via INTRAVENOUS
  Filled 2012-11-11: qty 1

## 2012-11-11 MED ORDER — ONDANSETRON HCL 4 MG PO TABS: 4.0000 mg | ORAL_TABLET | Freq: Four times a day (QID) | ORAL | Status: DC

## 2012-11-11 MED ORDER — OXYCODONE-ACETAMINOPHEN 5-325 MG PO TABS: 1.0000 | ORAL_TABLET | ORAL | Status: DC | PRN

## 2012-11-11 NOTE — ED Provider Notes (Signed)
CSN: [id number]     Arrival date & time 11/11/12  1116 History     First MD Initiated Contact with Patient 11/11/12 1151     Chief Complaint  Patient presents with  . Flank Pain   (Consider location/radiation/quality/duration/timing/severity/associated sxs/prior Treatment) Patient is a 57 y.o. male presenting with flank pain. The history is provided by the patient and the spouse. No language interpreter was used.  Flank Pain This is a new problem. The current episode started yesterday. The problem occurs constantly. The problem has been gradually worsening. Associated symptoms include nausea. Pertinent negatives include no chills, fever or vomiting. Associated symptoms comments: Right flank pain that radiates into RLQ abdomen similar to previous kidney stones. He has had nausea without vomiting. No fever. He denies hematuria, dysuria or groin pain..    Past Medical History  Diagnosis Date  . Anxiety   . Depression   . Gout   . DISH (diffuse idiopathic skeletal hyperostosis)   . Seasonal allergies   . Hyperlipidemia   . Kidney stone 2009   History reviewed. No pertinent past surgical history. Family History  Problem Relation Age of Onset  . Hypertension Mother   . Cancer Mother     uterine and colon cancers  . Hypertension Sister   . Hypertension Maternal Grandmother   . Heart disease Father    History  Substance Use Topics  . Smoking status: Never Smoker   . Smokeless tobacco: Not on file  . Alcohol Use: Yes    Review of Systems  Constitutional: Negative for fever and chills.  Respiratory: Negative.   Cardiovascular: Negative.   Gastrointestinal: Positive for nausea. Negative for vomiting.  Genitourinary: Positive for flank pain. Negative for dysuria and hematuria.  Musculoskeletal: Negative.   Skin: Negative.   Neurological: Negative.     Allergies  Penicillins  Home Medications   Current Outpatient Rx  Name  Route  Sig  Dispense  Refill  . azelastine  (ASTELIN) 137 MCG/SPRAY nasal spray   Nasal   Place 1 spray into the nose as needed. Use in each nostril as directed   90 mL   3   . Canagliflozin (INVOKANA) 100 MG TABS   Oral   Take 1 tablet (100 mg total) by mouth daily.   30 tablet      . candesartan-hydrochlorothiazide (ATACAND HCT) 32-12.5 MG per tablet   Oral   Take 1 tablet by mouth daily.   90 tablet   3   . celecoxib (CELEBREX) 200 MG capsule   Oral   Take 1 capsule (200 mg total) by mouth 2 (two) times daily.   180 capsule   3   . Cholecalciferol 50000 UNITS capsule   Oral   Take 50,000 Units by mouth 2 (two) times a week.           . DULoxetine (CYMBALTA) 60 MG capsule   Oral   Take 1 capsule (60 mg total) by mouth daily.   90 capsule   0     Dispense as written.    Please send BRAND Name only. Patient does not tole ...   . esomeprazole (NEXIUM) 40 MG capsule   Oral   Take 1 capsule (40 mg total) by mouth daily before breakfast.   90 capsule   3   . Exenatide ER (BYDUREON) 2 MG SUSR   Subcutaneous   Inject 2 mg into the skin once a week. Sunday         .  fluticasone (FLONASE) 50 MCG/ACT nasal spray   Nasal   Place 2 sprays into the nose daily. 2 sprays each nostril daily   48 g   3   . KRILL OIL 1000 MG CAPS   Oral   Take by mouth 2 (two) times daily.           . Melatonin 3 MG CAPS   Oral   Take 3 mg by mouth at bedtime as needed (for sleep).          . morphine (MSIR) 15 MG tablet   Oral   Take 1 tablet (15 mg total) by mouth every 4 (four) hours as needed for pain (breakthrough).   30 tablet   0   . Tapentadol HCl 75 MG TABS   Oral   Take 1 tablet (75 mg total) by mouth 2 (two) times daily.   180 tablet   0   . baclofen (LIORESAL) 10 MG tablet   Oral   Take 10 mg by mouth 3 (three) times daily as needed (muscle spasms).          . butalbital-acetaminophen-caffeine (ESGIC) 50-325-40 MG per tablet   Oral   Take 1 tablet by mouth 2 (two) times daily as needed for  pain or headache.   60 tablet   0   . Butenafine HCl 1 % cream   Topical   Apply topically 2 (two) times daily.         . cyclobenzaprine (FLEXERIL) 10 MG tablet   Oral   Take 10 mg by mouth 3 (three) times daily as needed.           . diclofenac sodium (VOLTAREN) 1 % GEL   Topical   Apply 1 application topically as needed.   300 g   3   . eletriptan (RELPAX) 40 MG tablet   Oral   One tablet by mouth at onset of headache. May repeat in 2 hours if headache persists or recurs. may repeat in 2 hours if necessary   10 tablet   5   . NON FORMULARY      Allergy Injections          . tadalafil (CIALIS) 20 MG tablet   Oral   Take 20 mg by mouth daily as needed.           . traMADol (ULTRAM) 50 MG tablet   Oral   Take 50 mg by mouth every 6 (six) hours as needed for pain.          BP 170/90  Pulse 93  Temp(Src) 97.8 F (36.6 C)  Resp 20  SpO2 98% Physical Exam  Constitutional: He is oriented to person, place, and time. He appears well-developed and well-nourished.  Patient pacing, uncomfortable in appearance.  Neck: Normal range of motion.  Pulmonary/Chest: Effort normal.  Abdominal: Soft. There is no tenderness.  Genitourinary:  No CVA tenderness.   Musculoskeletal: Normal range of motion.  Neurological: He is alert and oriented to person, place, and time. No cranial nerve deficit.  Skin: Skin is warm and dry.  Psychiatric: He has a normal mood and affect.    ED Course   Procedures (including critical care time)  Labs Reviewed  URINALYSIS, ROUTINE W REFLEX MICROSCOPIC   Ct Abdomen Pelvis Wo Contrast  11/11/2012   CLINICAL DATA:  Right-sided abdominal and pelvic pain. Nausea. Urolithiasis.  EXAM: CT ABDOMEN AND PELVIS WITHOUT CONTRAST  TECHNIQUE: Multidetector CT imaging of the abdomen and  pelvis was performed following the standard protocol without intravenous contrast.  COMPARISON:  04/18/2006  FINDINGS: Mild right hydronephrosis is seen as well as  perinephric stranding. Mild diffuse right ureteral dilatation seen. A 2 mm calculus is seen in the urinary bladder near the ureterovesical junction, which may have already passed into the bladder. No evidence of left-sided urinary calculi or left-sided hydronephrosis.  The other abdominal parenchymal organs are unremarkable appearance on this noncontrast study except for mild hepatic steatosis. Gallbladder is unremarkable. No soft tissue masses or lymphadenopathy identified. No other inflammatory process or abnormal fluid collections identified. Mild left colonic diverticulosis noted, without evidence of diverticulitis.  IMPRESSION: Mild right hydroureteronephrosis, with 2 mm calculus near the right ureterovesical junction, which may a 40 passed into the bladder.  Mild hepatic steatosis.  Diverticulosis. No radiographic evidence of diverticulitis.   Electronically Signed   By: Myles Rosenthal   On: 11/11/2012 12:58   No diagnosis found. 1. Kidney stone MDM  Pain is improved with Dilaudid and Toradol. Nausea resolved. CT findings c/w kidney stone. VSS. Feel he is stable for discharge home.   Arnoldo Hooker, PA-C 11/14/12 0501

## 2012-11-11 NOTE — ED Notes (Signed)
Pt with h/o kidney stones 35yrs ago presents with severe right flank pain and nausea.  Pt denies fevers.

## 2012-11-15 NOTE — ED Provider Notes (Signed)
Medical screening examination/treatment/procedure(s) were performed by non-physician practitioner and as supervising physician I was immediately available for consultation/collaboration.   Natilee Gauer H Brealyn Baril, MD 11/15/12 1033 

## 2012-11-23 DIAGNOSIS — M7660 Achilles tendinitis, unspecified leg: Secondary | ICD-10-CM | POA: Diagnosis not present

## 2012-11-23 DIAGNOSIS — M47812 Spondylosis without myelopathy or radiculopathy, cervical region: Secondary | ICD-10-CM | POA: Diagnosis not present

## 2012-11-29 ENCOUNTER — Encounter (HOSPITAL_COMMUNITY): Payer: Self-pay | Admitting: Psychiatry

## 2012-11-29 ENCOUNTER — Ambulatory Visit (INDEPENDENT_AMBULATORY_CARE_PROVIDER_SITE_OTHER): Payer: Medicare Other | Admitting: Psychiatry

## 2012-11-29 VITALS — BP 141/90 | HR 98 | Ht 71.0 in | Wt 264.2 lb

## 2012-11-29 DIAGNOSIS — F0630 Mood disorder due to known physiological condition, unspecified: Secondary | ICD-10-CM

## 2012-11-29 MED ORDER — ATIVAN 1 MG PO TABS: 1.0000 mg | ORAL_TABLET | Freq: Every day | ORAL | Status: DC | PRN

## 2012-11-29 MED ORDER — DULOXETINE HCL 60 MG PO CPEP: 60.0000 mg | ORAL_CAPSULE | Freq: Every day | ORAL | Status: DC

## 2012-11-29 NOTE — Progress Notes (Signed)
[facility] [id number] Progress Note  Vincent [name] [id number] 57 y.o.  11/29/2012 3:32 PM  Chief Complaint:  I'm doing better on the medication.  I have a kidney stone last month.  History of Present Illness:  Patient is a 57 year old Caucasian married man who came for his followup appointment.  Patient is compliant with Cymbalta.  He was last seen in February 2014.  He is taking brand name Cymbalta and Ativan is helping his anxiety and nervousness.  Last month patient has a kidney stone and he visited the emergency room.  His kidney stone were passed finally.  Patient admitted increase pain at that time.  Overall he is doing better on his current psychiatric medication.  He went to [place] with his wife to explore retirement facility however his wife does not like that much.  Patient also felt that it is too early to move to [place].  Patient is also looking other option like [place].  The patient denies any irritability or anger.  He continues to have chronic pain mostly when it is raining and cold.  Patient is also going to [place] next month to attend wedding.  The patient has any side effects of medication.  He takes Ativan only as needed.  He need any refills today.  Suicidal Ideation: No Plan Formed: No Patient has means to carry out plan: No  Homicidal Ideation: No Plan Formed: No Patient has means to carry out plan: No  Review of Systems: Psychiatric: Agitation: No Hallucination: No Depressed Mood: No Insomnia: No Hypersomnia: No Altered Concentration: No Feels Worthless: No Grandiose Ideas: No Belief In Special Powers: No New/Increased Substance Abuse: No Compulsions: No  Neurologic: Headache: No Seizure: No Paresthesias: Chronic pain and numbness.  Complain of back pain and joint pain.  Medical History;  Patient has a history of gout which is controlled by diet.  He has diffuse idiopathic skeletal hyperostosis, diabetes mellitus , seasonal allergies,  hyperlipidemia, chronic back pain, osteoporosis, hypertension and recently kidney stones.  His primary care physician is Dr. Lovell Sheehan.    Psychosocial History: The patient lives with her wife who is a Designer, jewellery.  The patient has no children.  He is retired.  Outpatient Encounter Prescriptions as of 11/29/2012  Medication Sig Dispense Refill  . azelastine (ASTELIN) 137 MCG/SPRAY nasal spray Place 1 spray into the nose as needed. Use in each nostril as directed  90 mL  3  . baclofen (LIORESAL) 10 MG tablet Take 10 mg by mouth 3 (three) times daily as needed (muscle spasms).       . butalbital-acetaminophen-caffeine (ESGIC) 50-325-40 MG per tablet Take 1 tablet by mouth 2 (two) times daily as needed for pain or headache.  60 tablet  0  . Butenafine HCl 1 % cream Apply topically 2 (two) times daily.      . Canagliflozin (INVOKANA) 100 MG TABS Take 1 tablet (100 mg total) by mouth daily.  30 tablet    . candesartan-hydrochlorothiazide (ATACAND HCT) 32-12.5 MG per tablet Take 1 tablet by mouth daily.  90 tablet  3  . celecoxib (CELEBREX) 200 MG capsule Take 1 capsule (200 mg total) by mouth 2 (two) times daily.  180 capsule  3  . Cholecalciferol 50000 UNITS capsule Take 50,000 Units by mouth 2 (two) times a week.        . cyclobenzaprine (FLEXERIL) 10 MG tablet Take 10 mg by mouth 3 (three) times daily as needed.        Marland Kitchen  diclofenac sodium (VOLTAREN) 1 % GEL Apply 1 application topically as needed.  300 g  3  . DULoxetine (CYMBALTA) 60 MG capsule Take 1 capsule (60 mg total) by mouth daily.  90 capsule  0  . eletriptan (RELPAX) 40 MG tablet One tablet by mouth at onset of headache. May repeat in 2 hours if headache persists or recurs. may repeat in 2 hours if necessary  10 tablet  5  . esomeprazole (NEXIUM) 40 MG capsule Take 1 capsule (40 mg total) by mouth daily before breakfast.  90 capsule  3  . Exenatide ER (BYDUREON) 2 MG SUSR Inject 2 mg into the skin once a week. Sunday      . fluticasone  (FLONASE) 50 MCG/ACT nasal spray Place 2 sprays into the nose daily. 2 sprays each nostril daily  48 g  3  . ketorolac (TORADOL) 10 MG tablet Take 1 tablet (10 mg total) by mouth every 6 (six) hours as needed for pain. MAXIMUM 40 MG PER DAY  20 tablet  0  . KRILL OIL 1000 MG CAPS Take by mouth 2 (two) times daily.        . Melatonin 3 MG CAPS Take 3 mg by mouth at bedtime as needed (for sleep).       . morphine (MSIR) 15 MG tablet Take 1 tablet (15 mg total) by mouth every 4 (four) hours as needed for pain (breakthrough).  30 tablet  0  . NON FORMULARY Allergy Injections       . ondansetron (ZOFRAN) 4 MG tablet Take 1 tablet (4 mg total) by mouth every 6 (six) hours.  12 tablet  0  . oxyCODONE-acetaminophen (PERCOCET/ROXICET) 5-325 MG per tablet Take 1-2 tablets by mouth every 4 (four) hours as needed for pain.  15 tablet  0  . tadalafil (CIALIS) 20 MG tablet Take 20 mg by mouth daily as needed.        . Tapentadol HCl 75 MG TABS Take 1 tablet (75 mg total) by mouth 2 (two) times daily.  180 tablet  0  . traMADol (ULTRAM) 50 MG tablet Take 50 mg by mouth every 6 (six) hours as needed for pain.      . [DISCONTINUED] DULoxetine (CYMBALTA) 60 MG capsule Take 1 capsule (60 mg total) by mouth daily.  90 capsule  0  . ATIVAN 1 MG tablet Take 1 tablet (1 mg total) by mouth daily as needed for anxiety.  30 tablet  0   No facility-administered encounter medications on file as of 11/29/2012.    Recent Results (from the past 2160 hour(s))  LIPID PANEL     Status: Abnormal   Collection Time    11/10/12 12:40 PM      Result Value Range   Cholesterol 215 (*) 0 - 200 mg/dL   Comment: ATP III Classification       Desirable:  < 200 mg/dL               Borderline High:  200 - 239 mg/dL          High:  > = 846 mg/dL   Triglycerides 962.9 (*) 0.0 - 149.0 mg/dL   Comment: Normal:  <528 mg/dLBorderline High:  150 - 199 mg/dL   HDL 41.32 (*) >44.01 mg/dL   VLDL 02.7 (*) 0.0 - 25.3 mg/dL   Total CHOL/HDL Ratio 8      Comment:  Men          Women1/2 Average Risk     3.4          3.3Average Risk          5.0          4.42X Average Risk          9.6          7.13X Average Risk          15.0          11.0                      LDL CHOLESTEROL, DIRECT     Status: None   Collection Time    11/10/12 12:40 PM      Result Value Range   Direct LDL 155.9     Comment: Optimal:  <100 mg/dLNear or Above Optimal:  100-129 mg/dLBorderline High:  130-159 mg/dLHigh:  160-189 mg/dLVery High:  >190 mg/dL  URINALYSIS, ROUTINE W REFLEX MICROSCOPIC     Status: Abnormal   Collection Time    11/11/12  2:06 PM      Result Value Range   Color, Urine YELLOW  YELLOW   APPearance CLOUDY (*) CLEAR   Specific Gravity, Urine 1.037 (*) 1.005 - 1.030   pH 5.0  5.0 - 8.0   Glucose, UA >1000 (*) NEGATIVE mg/dL   Hgb urine dipstick TRACE (*) NEGATIVE   Bilirubin Urine NEGATIVE  NEGATIVE   Ketones, ur 15 (*) NEGATIVE mg/dL   Protein, ur NEGATIVE  NEGATIVE mg/dL   Urobilinogen, UA 0.2  0.0 - 1.0 mg/dL   Nitrite NEGATIVE  NEGATIVE   Leukocytes, UA NEGATIVE  NEGATIVE  URINE MICROSCOPIC-ADD ON     Status: None   Collection Time    11/11/12  2:06 PM      Result Value Range   Squamous Epithelial / LPF RARE  RARE   RBC / HPF 0-2  <3 RBC/hpf    Past Psychiatric History/Hospitalization(s) Patient denies any previous history of psychiatric inpatient treatment or any suicidal attempt.  He has history of anxiety and depression mostly due to his chronic back pain and neck pain.  In the past he has taken Paxil and Neurontin however he liked Cymbalta better.  Patient denies any history of paranoia, hallucination, mania, psychosis or any agitation. Anxiety: Yes Bipolar Disorder: No Depression: No Mania: No Psychosis: No Schizophrenia: No Personality Disorder: No Hospitalization for psychiatric illness: No History of Electroconvulsive Shock Therapy: No Prior Suicide Attempts: No  Physical Exam: Constitutional:  BP 141/90   Pulse 98  Ht 5\' 11"  (1.803 m)  Wt 264 lb 3.2 oz (119.84 kg)  BMI 36.86 kg/m2  Musculoskeletal: Strength & Muscle Tone: spastic Gait & Station: Using a cane to walk because of pain Patient leans: Front  Mental Status Examination;  Patient is casually dressed and fairly groomed. He is calm and cooperative. He maintained fair eye contact. His speech is slow with normal tone and volume. His thought processes logical and goal-directed. He denies any active or passive suicidal thoughts or homicidal thoughts. His attention and concentration is fair. He denies any auditory or visual hallucination. His fund of knowledge is adequate. There no psychotic symptoms present. He's alert and oriented x3. He described his mood as down his affect is constricted. 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 Anxiety   . Depression   . Gout   . DISH (diffuse idiopathic skeletal hyperostosis)   . Seasonal allergies   . Hyperlipidemia   . Kidney stone 2009    Axis IV: Mild   Plan:  I review his current medication, recent discharge summary from emergency room in response to his medication.  The patient hemoglobin A1c is 7.1.  He is in a close consultation with his primary care physician to manage his blood sugar.  I will continue Cymbalta Prandin 60 mg daily.  He will require a new prescription of Ativan 1 mg as needed.  Patient does not abuse his Ativan are asked for early refills.  Recommend to call us back if he has any questions or concerns.  Followup in 4 months.Time spent 25  minutes.  More than 50% of the time spent in psychoeducation, counseling and coordination of care.  Discuss safety plan that anytime having active suicidal thoughts or homicidal thoughts then patient need to call 911 or go to the local emergency room.  Maeci Kalbfleisch T., MD 11/29/2012

## 2012-12-07 DIAGNOSIS — M7660 Achilles tendinitis, unspecified leg: Secondary | ICD-10-CM | POA: Diagnosis not present

## 2012-12-07 DIAGNOSIS — M47812 Spondylosis without myelopathy or radiculopathy, cervical region: Secondary | ICD-10-CM | POA: Diagnosis not present

## 2012-12-27 DIAGNOSIS — M25579 Pain in unspecified ankle and joints of unspecified foot: Secondary | ICD-10-CM | POA: Diagnosis not present

## 2013-01-04 DIAGNOSIS — M7660 Achilles tendinitis, unspecified leg: Secondary | ICD-10-CM | POA: Diagnosis not present

## 2013-01-04 DIAGNOSIS — M47812 Spondylosis without myelopathy or radiculopathy, cervical region: Secondary | ICD-10-CM | POA: Diagnosis not present

## 2013-01-18 DIAGNOSIS — M47812 Spondylosis without myelopathy or radiculopathy, cervical region: Secondary | ICD-10-CM | POA: Diagnosis not present

## 2013-01-18 DIAGNOSIS — M7660 Achilles tendinitis, unspecified leg: Secondary | ICD-10-CM | POA: Diagnosis not present

## 2013-02-01 ENCOUNTER — Other Ambulatory Visit: Payer: Self-pay

## 2013-02-05 DIAGNOSIS — M47812 Spondylosis without myelopathy or radiculopathy, cervical region: Secondary | ICD-10-CM | POA: Diagnosis not present

## 2013-02-05 DIAGNOSIS — M7660 Achilles tendinitis, unspecified leg: Secondary | ICD-10-CM | POA: Diagnosis not present

## 2013-02-15 DIAGNOSIS — M7660 Achilles tendinitis, unspecified leg: Secondary | ICD-10-CM | POA: Diagnosis not present

## 2013-02-15 DIAGNOSIS — M47812 Spondylosis without myelopathy or radiculopathy, cervical region: Secondary | ICD-10-CM | POA: Diagnosis not present

## 2013-03-01 DIAGNOSIS — M47812 Spondylosis without myelopathy or radiculopathy, cervical region: Secondary | ICD-10-CM | POA: Diagnosis not present

## 2013-03-01 DIAGNOSIS — M7660 Achilles tendinitis, unspecified leg: Secondary | ICD-10-CM | POA: Diagnosis not present

## 2013-03-02 ENCOUNTER — Ambulatory Visit: Payer: Self-pay | Admitting: Internal Medicine

## 2013-03-02 ENCOUNTER — Encounter: Payer: Self-pay | Admitting: Internal Medicine

## 2013-03-02 ENCOUNTER — Ambulatory Visit (INDEPENDENT_AMBULATORY_CARE_PROVIDER_SITE_OTHER): Payer: Medicare Other | Admitting: Internal Medicine

## 2013-03-02 VITALS — BP 140/84 | HR 92 | Temp 98.1°F | Resp 18 | Ht 71.0 in | Wt 262.0 lb

## 2013-03-02 DIAGNOSIS — I10 Essential (primary) hypertension: Secondary | ICD-10-CM | POA: Diagnosis not present

## 2013-03-02 DIAGNOSIS — M542 Cervicalgia: Secondary | ICD-10-CM | POA: Diagnosis not present

## 2013-03-02 DIAGNOSIS — E1165 Type 2 diabetes mellitus with hyperglycemia: Secondary | ICD-10-CM

## 2013-03-02 DIAGNOSIS — E785 Hyperlipidemia, unspecified: Secondary | ICD-10-CM

## 2013-03-02 DIAGNOSIS — E783 Hyperchylomicronemia: Secondary | ICD-10-CM

## 2013-03-02 DIAGNOSIS — F0630 Mood disorder due to known physiological condition, unspecified: Secondary | ICD-10-CM | POA: Diagnosis not present

## 2013-03-02 DIAGNOSIS — N200 Calculus of kidney: Secondary | ICD-10-CM | POA: Diagnosis not present

## 2013-03-02 DIAGNOSIS — K219 Gastro-esophageal reflux disease without esophagitis: Secondary | ICD-10-CM

## 2013-03-02 LAB — HEMOGLOBIN A1C: Mean Plasma Glucose: 180 mg/dL — ABNORMAL HIGH (ref <117)

## 2013-03-02 MED ORDER — METFORMIN HCL 500 MG PO TABS: 500.0000 mg | ORAL_TABLET | Freq: Two times a day (BID) | ORAL | Status: DC

## 2013-03-02 MED ORDER — DULOXETINE HCL 60 MG PO CPEP: 60.0000 mg | ORAL_CAPSULE | Freq: Every day | ORAL | Status: DC

## 2013-03-02 MED ORDER — CANDESARTAN CILEXETIL-HCTZ 32-12.5 MG PO TABS: 1.0000 | ORAL_TABLET | Freq: Every day | ORAL | Status: DC

## 2013-03-02 MED ORDER — ESOMEPRAZOLE MAGNESIUM 40 MG PO CPDR: 40.0000 mg | DELAYED_RELEASE_CAPSULE | Freq: Every day | ORAL | Status: DC

## 2013-03-02 NOTE — Progress Notes (Signed)
Subjective:    Patient ID: Vincent [name], male    DOB: Jul 20, 1955, 57 y.o.   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 Negative for fever.  HENT: Negative for congestion, hearing loss and postnasal drip.   Eyes: Negative for discharge, redness and visual disturbance.  Respiratory: Negative for cough, shortness of breath and wheezing.   Cardiovascular: Positive for leg swelling.  Gastrointestinal: Positive for abdominal distention. Negative for abdominal pain and constipation.  Genitourinary: Negative for urgency and frequency.  Musculoskeletal: Positive for joint swelling, myalgias, neck pain and neck stiffness. Negative for arthralgias.  Skin: Negative for color change and rash.  Neurological: Negative for weakness and light-headedness.  Hematological: Negative for adenopathy.  Psychiatric/Behavioral: Negative for behavioral problems.   Past Medical History  Diagnosis Date  . Anxiety   . Depression   . Gout   . DISH (diffuse idiopathic skeletal hyperostosis)   . Seasonal allergies   . Hyperlipidemia   . Kidney stone 2009    History   Social History  . Marital Status: Married    Spouse Name: N/A    Number of Children: N/A  . Years of Education: N/A   Occupational History  . Not on file.   Social History Main Topics  . Smoking status: Never Smoker   . Smokeless tobacco: Not on file  . Alcohol Use: Yes  . Drug Use: No  . Sexual Activity: Yes   Other Topics Concern  . Not on file   Social History Narrative  . No narrative on file    No past surgical history on file.  Family History  Problem Relation Age of Onset  . Hypertension Mother   . Cancer Mother     uterine and colon cancers  . Hypertension Sister   . Hypertension Maternal Grandmother   . Heart disease Father      Allergies  Allergen Reactions  . Penicillins     Unknown- allergic to molds    Current Outpatient Prescriptions on File Prior to Visit  Medication Sig Dispense Refill  . ATIVAN 1 MG tablet Take 1 tablet (1 mg total) by mouth daily as needed for anxiety.  30 tablet  0  . azelastine (ASTELIN) 137 MCG/SPRAY nasal spray Place 1 spray into the nose as needed. Use in each nostril as directed  90 mL  3  . baclofen (LIORESAL) 10 MG tablet Take 10 mg by mouth 3 (three) times daily as needed (muscle spasms).       . butalbital-acetaminophen-caffeine (ESGIC) 50-325-40 MG per tablet Take 1 tablet by mouth 2 (two) times daily as needed for pain or headache.  60 tablet  0  . Butenafine HCl 1 % cream Apply topically 2 (two) times daily.      . Canagliflozin (INVOKANA) 100 MG TABS Take 1 tablet (100 mg total) by mouth daily.  30 tablet    . candesartan-hydrochlorothiazide (ATACAND HCT) 32-12.5 MG per tablet Take 1 tablet by mouth daily.  90 tablet  3  . celecoxib (CELEBREX) 200 MG capsule Take 1 capsule (200 mg total) by mouth 2 (two) times daily.  180 capsule  3  . Cholecalciferol 50000 UNITS capsule Take 50,000 Units by mouth 2 (two) times a week.        . cyclobenzaprine (FLEXERIL) 10 MG tablet  Take 10 mg by mouth 3 (three) times daily as needed.        . diclofenac sodium (VOLTAREN) 1 % GEL Apply 1 application topically as needed.  300 g  3  . DULoxetine (CYMBALTA) 60 MG capsule Take 1 capsule (60 mg total) by mouth daily.  90 capsule  0  . eletriptan (RELPAX) 40 MG tablet One tablet by mouth at onset of headache. May repeat in 2 hours if headache persists or recurs. may repeat in 2 hours if necessary  10 tablet  5  . esomeprazole (NEXIUM) 40 MG capsule Take 1 capsule (40 mg total) by mouth daily before breakfast.  90 capsule  3  . Exenatide ER (BYDUREON) 2 MG SUSR Inject 2 mg into the skin once a week. Sunday      . fluticasone (FLONASE) 50 MCG/ACT nasal spray Place 2 sprays into the nose daily. 2  sprays each nostril daily  48 g  3  . ketorolac (TORADOL) 10 MG tablet Take 1 tablet (10 mg total) by mouth every 6 (six) hours as needed for pain. MAXIMUM 40 MG PER DAY  20 tablet  0  . KRILL OIL 1000 MG CAPS Take by mouth 2 (two) times daily.        . Melatonin 3 MG CAPS Take 3 mg by mouth at bedtime as needed (for sleep).       . morphine (MSIR) 15 MG tablet Take 1 tablet (15 mg total) by mouth every 4 (four) hours as needed for pain (breakthrough).  30 tablet  0  . NON FORMULARY Allergy Injections       . ondansetron (ZOFRAN) 4 MG tablet Take 1 tablet (4 mg total) by mouth every 6 (six) hours.  12 tablet  0  . oxyCODONE-acetaminophen (PERCOCET/ROXICET) 5-325 MG per tablet Take 1-2 tablets by mouth every 4 (four) hours as needed for pain.  15 tablet  0  . tadalafil (CIALIS) 20 MG tablet Take 20 mg by mouth daily as needed.        . Tapentadol HCl 75 MG TABS Take 1 tablet (75 mg total) by mouth 2 (two) times daily.  180 tablet  0  . traMADol (ULTRAM) 50 MG tablet Take 50 mg by mouth every 6 (six) hours as needed for pain.       No current facility-administered medications on file prior to visit.    There were no vitals taken for this visit.        Objective:   Physical Exam  Vitals reviewed. Constitutional: He appears well-developed and well-nourished.  HENT:  Head: Normocephalic and atraumatic.  Eyes: Conjunctivae are normal. Pupils are equal, round, and reactive to light.  Neck: Normal range of motion. Neck supple.  Cardiovascular: Normal rate and regular rhythm.   Murmur heard. Pulmonary/Chest: Effort normal and breath sounds normal.  Abdominal: Soft. Bowel sounds are normal.          Assessment & Plan:  DISH Renal stones and hospital evaluation noted Submit stone for analysis History of prior stones  Trial of invokanna and had urinary symptoms Resumed metfomin BID and has been tolerating this  Trial of crestor 20 once a week

## 2013-03-02 NOTE — Progress Notes (Signed)
Pre visit review using our clinic review tool, if applicable. No additional management support is needed unless otherwise documented below in the visit note. 

## 2013-03-02 NOTE — Patient Instructions (Signed)
The patient is instructed to continue all medications as prescribed. Schedule followup with check out clerk upon leaving the clinic  

## 2013-03-06 LAB — STONE ANALYSIS: Stone Weight KSTONE: 0.008 g

## 2013-03-15 DIAGNOSIS — M47812 Spondylosis without myelopathy or radiculopathy, cervical region: Secondary | ICD-10-CM | POA: Diagnosis not present

## 2013-03-15 DIAGNOSIS — M7660 Achilles tendinitis, unspecified leg: Secondary | ICD-10-CM | POA: Diagnosis not present

## 2013-04-03 ENCOUNTER — Ambulatory Visit (INDEPENDENT_AMBULATORY_CARE_PROVIDER_SITE_OTHER): Payer: Medicare Other | Admitting: Psychiatry

## 2013-04-03 ENCOUNTER — Encounter (HOSPITAL_COMMUNITY): Payer: Self-pay | Admitting: Psychiatry

## 2013-04-03 VITALS — BP 170/99 | HR 96 | Ht 71.0 in | Wt 264.0 lb

## 2013-04-03 DIAGNOSIS — F0630 Mood disorder due to known physiological condition, unspecified: Secondary | ICD-10-CM

## 2013-04-03 NOTE — Progress Notes (Signed)
[facility] Progress Note  Vincent [name] [id number] 58 y.o.  04/03/2013 1:46 PM  Chief Complaint:  Medication management and followup.    History of Present Illness:  Vincent [name] came for his followup appointment.  His compliance with his Cymbalta.  He denies any side effects.  Recently he has noticed increased blood pressure and pulse .  He also noticed sometimes anxious but denies any depressive thoughts.  He has chronic pain in some time if weather is very cold his pain intensity get worse.  He said he seen his primary care physician Dr. Arnoldo Morale .  He did not recall any changes in his medication.  His hemoglobin A1c is 7.9 .  He had good holidays.  Denies any agitation, anger or any mood swing.  He takes Ativan only as needed.  His primary care physician recently given refills on his Cymbalta for one year.  He does not require any new Ativan prescription.  He denies any paranoia , hallucination or any aggression.  He has not decided yet about his long-term plan for living.  He was interested in [place] but because of cold and heavy snow he is not sure about moving there.    Suicidal Ideation: No Plan Formed: No Patient has means to carry out plan: No  Homicidal Ideation: No Plan Formed: No Patient has means to carry out plan: No  Review of Systems: Psychiatric: Agitation: No Hallucination: No Depressed Mood: No Insomnia: No Hypersomnia: No Altered Concentration: No Feels Worthless: No Grandiose Ideas: No Belief In Special Powers: No New/Increased Substance Abuse: No Compulsions: No  Neurologic: Headache: No Seizure: No Paresthesias: Chronic pain and numbness.  Complain of back pain and joint pain.  Medical History;  Patient has a history of gout which is controlled by diet.  He has diffuse idiopathic skeletal hyperostosis, diabetes mellitus , seasonal allergies, hyperlipidemia, chronic back pain, osteoporosis, hypertension and recently kidney stones.  His  primary care physician is Dr. Arnoldo Morale.    Psychosocial History: The patient lives with her wife who is a Equities trader.  He has no children.  He is retired.  Outpatient Encounter Prescriptions as of 04/03/2013  Medication Sig  . ATIVAN 1 MG tablet Take 1 tablet (1 mg total) by mouth daily as needed for anxiety.  . diclofenac sodium (VOLTAREN) 1 % GEL Apply 1 application topically as needed.  . DULoxetine (CYMBALTA) 60 MG capsule Take 1 capsule (60 mg total) by mouth daily.  Marland Kitchen morphine (MSIR) 15 MG tablet Take 1 tablet (15 mg total) by mouth every 4 (four) hours as needed for pain (breakthrough).  Marland Kitchen azelastine (ASTELIN) 137 MCG/SPRAY nasal spray Place 1 spray into the nose as needed. Use in each nostril as directed  . baclofen (LIORESAL) 10 MG tablet Take 10 mg by mouth 3 (three) times daily as needed (muscle spasms).   . butalbital-acetaminophen-caffeine (ESGIC) 50-325-40 MG per tablet Take 1 tablet by mouth 2 (two) times daily as needed for pain or headache.  . Butenafine HCl 1 % cream Apply topically 2 (two) times daily.  . candesartan-hydrochlorothiazide (ATACAND HCT) 32-12.5 MG per tablet Take 1 tablet by mouth daily.  . celecoxib (CELEBREX) 200 MG capsule Take 1 capsule (200 mg total) by mouth 2 (two) times daily.  . Cholecalciferol 50000 UNITS capsule Take 50,000 Units by mouth 2 (two) times a week.    . cyclobenzaprine (FLEXERIL) 10 MG tablet Take 10 mg by mouth 3 (three) times daily as needed.    Marland Kitchen  eletriptan (RELPAX) 40 MG tablet One tablet by mouth at onset of headache. May repeat in 2 hours if headache persists or recurs. may repeat in 2 hours if necessary  . esomeprazole (NEXIUM) 40 MG capsule Take 1 capsule (40 mg total) by mouth daily before breakfast.  . Exenatide ER (BYDUREON) 2 MG SUSR Inject 2 mg into the skin once a week. Sunday  . fluticasone (FLONASE) 50 MCG/ACT nasal spray Place 2 sprays into the nose daily. 2 sprays each nostril daily  . ketorolac (TORADOL) 10 MG tablet  Take 1 tablet (10 mg total) by mouth every 6 (six) hours as needed for pain. MAXIMUM 40 MG PER DAY  . Melatonin 3 MG CAPS Take 3 mg by mouth at bedtime as needed (for sleep).   . metFORMIN (GLUCOPHAGE) 500 MG tablet Take 1 tablet (500 mg total) by mouth 2 (two) times daily with a meal.  . NON FORMULARY Allergy Injections   . ondansetron (ZOFRAN) 4 MG tablet Take 1 tablet (4 mg total) by mouth every 6 (six) hours.  Marland Kitchen oxyCODONE-acetaminophen (PERCOCET/ROXICET) 5-325 MG per tablet Take 1-2 tablets by mouth every 4 (four) hours as needed for pain.  . rosuvastatin (CRESTOR) 20 MG tablet Take 20 mg by mouth once a week.  . tadalafil (CIALIS) 20 MG tablet Take 20 mg by mouth daily as needed.    . Tapentadol HCl 75 MG TABS Take 1 tablet (75 mg total) by mouth 2 (two) times daily.  . traMADol (ULTRAM) 50 MG tablet Take 50 mg by mouth every 6 (six) hours as needed for pain.    Recent Results (from the past 2160 hour(s))  STONE ANALYSIS     Status: None   Collection Time    03/02/13  3:20 PM      Result Value Range   Component 1 KSTONE See Below     Comment: Calcium Oxalate Monohydrate ([place]) 60%     Calcium Oxalate Dihydrate ([place]) 40%   Stone Weight KSTONE 0.0080     Nidus Not observed    HEMOGLOBIN A1C     Status: Abnormal   Collection Time    03/02/13  3:20 PM      Result Value Range   Hemoglobin A1C 7.9 (*) <5.7 %   Comment:                                                                            According to the ADA Clinical Practice Recommendations for 2011, when     HbA1c is used as a screening test:             >=6.5%   Diagnostic of Diabetes Mellitus                (if abnormal result is confirmed)           5.7-6.4%   Increased risk of developing Diabetes Mellitus           References:Diagnosis and Classification of Diabetes Mellitus,Diabetes     WGYK,5993,57(SVXBL 1):S62-S69 and Standards of Medical Care in             Diabetes - 2011,Diabetes Care,2011,34 (Suppl  1):S11-S61.  Mean Plasma Glucose 180 (*) <117 mg/dL    Past Psychiatric History/Hospitalization(s) Patient denies any previous history of psychiatric inpatient treatment or any suicidal attempt.  He has history of anxiety and depression mostly due to his chronic back pain and neck pain.  In the past he has taken Paxil and Neurontin however he liked Cymbalta better.  Patient denies any history of paranoia, hallucination, mania, psychosis or any agitation. Anxiety: Yes Bipolar Disorder: No Depression: No Mania: No Psychosis: No Schizophrenia: No Personality Disorder: No Hospitalization for psychiatric illness: No History of Electroconvulsive Shock Therapy: No Prior Suicide Attempts: No  Physical Exam: Constitutional:  BP 170/99  Pulse 96  Ht 5\' 11"  (1.803 m)  Wt 264 lb (119.75 kg)  BMI 36.84 kg/m2  Musculoskeletal: Strength & Muscle Tone: spastic Gait & Station: Using a cane to walk because of pain Patient leans: Front  Mental Status Examination;  Patient is casually dressed and fairly groomed. He is calm and cooperative. He maintained good eye contact. His speech is slow with normal tone and volume. His thought processes logical and goal-directed. He denies any active or passive suicidal thoughts or homicidal thoughts. His attention and concentration is fair. He denies any auditory or visual hallucination. His fund of knowledge is adequate. There no psychotic symptoms present. He's alert and oriented x3. He described his mood as down his affect is constricted. 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 Anxiety   . Depression    . Gout   . DISH (diffuse idiopathic skeletal hyperostosis)   . Seasonal allergies   . Hyperlipidemia   . Kidney stone 2009    Axis IV: Mild   Plan:  I review his current medication, blood results.  I recommended to contact his primary care physician Inderal can be added to help his blood pressure , pulse and anxiety.  Patient does not require any new refills on his Cymbalta and Ativan at this time.  Recommend to call us back if he has any question or any concern.  Followup in 6 months.  Lyndel Dancel T., MD 04/03/2013

## 2013-04-05 DIAGNOSIS — M7660 Achilles tendinitis, unspecified leg: Secondary | ICD-10-CM | POA: Diagnosis not present

## 2013-04-05 DIAGNOSIS — M47812 Spondylosis without myelopathy or radiculopathy, cervical region: Secondary | ICD-10-CM | POA: Diagnosis not present

## 2013-04-19 DIAGNOSIS — M47812 Spondylosis without myelopathy or radiculopathy, cervical region: Secondary | ICD-10-CM | POA: Diagnosis not present

## 2013-04-19 DIAGNOSIS — M7660 Achilles tendinitis, unspecified leg: Secondary | ICD-10-CM | POA: Diagnosis not present

## 2013-05-03 DIAGNOSIS — M7660 Achilles tendinitis, unspecified leg: Secondary | ICD-10-CM | POA: Diagnosis not present

## 2013-05-03 DIAGNOSIS — M47812 Spondylosis without myelopathy or radiculopathy, cervical region: Secondary | ICD-10-CM | POA: Diagnosis not present

## 2013-05-17 DIAGNOSIS — M47812 Spondylosis without myelopathy or radiculopathy, cervical region: Secondary | ICD-10-CM | POA: Diagnosis not present

## 2013-05-17 DIAGNOSIS — M7660 Achilles tendinitis, unspecified leg: Secondary | ICD-10-CM | POA: Diagnosis not present

## 2013-05-31 DIAGNOSIS — M47812 Spondylosis without myelopathy or radiculopathy, cervical region: Secondary | ICD-10-CM | POA: Diagnosis not present

## 2013-05-31 DIAGNOSIS — M7660 Achilles tendinitis, unspecified leg: Secondary | ICD-10-CM | POA: Diagnosis not present

## 2013-06-13 DIAGNOSIS — M7660 Achilles tendinitis, unspecified leg: Secondary | ICD-10-CM | POA: Diagnosis not present

## 2013-06-13 DIAGNOSIS — M47812 Spondylosis without myelopathy or radiculopathy, cervical region: Secondary | ICD-10-CM | POA: Diagnosis not present

## 2013-07-05 DIAGNOSIS — M47812 Spondylosis without myelopathy or radiculopathy, cervical region: Secondary | ICD-10-CM | POA: Diagnosis not present

## 2013-07-05 DIAGNOSIS — M7660 Achilles tendinitis, unspecified leg: Secondary | ICD-10-CM | POA: Diagnosis not present

## 2013-07-19 DIAGNOSIS — M7660 Achilles tendinitis, unspecified leg: Secondary | ICD-10-CM | POA: Diagnosis not present

## 2013-07-19 DIAGNOSIS — M47812 Spondylosis without myelopathy or radiculopathy, cervical region: Secondary | ICD-10-CM | POA: Diagnosis not present

## 2013-07-23 ENCOUNTER — Other Ambulatory Visit: Payer: Self-pay

## 2013-07-23 ENCOUNTER — Telehealth: Payer: Self-pay

## 2013-07-23 DIAGNOSIS — E785 Hyperlipidemia, unspecified: Secondary | ICD-10-CM

## 2013-07-23 NOTE — Telephone Encounter (Signed)
Pt's wife called and requested that pt had labs done at [facility] for follow up. Orders placed and pt's wife is aware.

## 2013-07-25 ENCOUNTER — Other Ambulatory Visit (INDEPENDENT_AMBULATORY_CARE_PROVIDER_SITE_OTHER): Payer: Medicare Other

## 2013-07-25 DIAGNOSIS — E1165 Type 2 diabetes mellitus with hyperglycemia: Secondary | ICD-10-CM

## 2013-07-25 DIAGNOSIS — E785 Hyperlipidemia, unspecified: Secondary | ICD-10-CM

## 2013-07-25 DIAGNOSIS — E1169 Type 2 diabetes mellitus with other specified complication: Principal | ICD-10-CM

## 2013-07-25 DIAGNOSIS — IMO0002 Reserved for concepts with insufficient information to code with codable children: Secondary | ICD-10-CM

## 2013-07-25 LAB — LIPID PANEL
CHOL/HDL RATIO: 5
Cholesterol: 163 mg/dL (ref 0–200)
HDL: 32.8 mg/dL — AB (ref >39.00)
LDL CALC: 109 mg/dL — AB (ref 0–99)
TRIGLYCERIDES: 107 mg/dL (ref 0.0–149.0)
VLDL: 21.4 mg/dL (ref 0.0–40.0)

## 2013-07-25 LAB — HEPATIC FUNCTION PANEL
ALT: 25 U/L (ref 0–53)
AST: 17 U/L (ref 0–37)
Albumin: 4.2 g/dL (ref 3.5–5.2)
Alkaline Phosphatase: 69 U/L (ref 39–117)
BILIRUBIN DIRECT: 0.1 mg/dL (ref 0.0–0.3)
BILIRUBIN TOTAL: 0.5 mg/dL (ref 0.3–1.2)
Total Protein: 7.4 g/dL (ref 6.0–8.3)

## 2013-07-27 ENCOUNTER — Ambulatory Visit (INDEPENDENT_AMBULATORY_CARE_PROVIDER_SITE_OTHER): Payer: Medicare Other | Admitting: Internal Medicine

## 2013-07-27 ENCOUNTER — Ambulatory Visit: Payer: Self-pay | Admitting: Internal Medicine

## 2013-07-27 ENCOUNTER — Encounter: Payer: Self-pay | Admitting: Internal Medicine

## 2013-07-27 VITALS — BP 140/90 | HR 84 | Temp 97.9°F | Ht 71.0 in | Wt 263.0 lb

## 2013-07-27 DIAGNOSIS — I10 Essential (primary) hypertension: Secondary | ICD-10-CM

## 2013-07-27 DIAGNOSIS — G43909 Migraine, unspecified, not intractable, without status migrainosus: Secondary | ICD-10-CM

## 2013-07-27 DIAGNOSIS — E1165 Type 2 diabetes mellitus with hyperglycemia: Secondary | ICD-10-CM

## 2013-07-27 DIAGNOSIS — IMO0001 Reserved for inherently not codable concepts without codable children: Secondary | ICD-10-CM

## 2013-07-27 MED ORDER — METOPROLOL SUCCINATE ER 25 MG PO TB24: 25.0000 mg | ORAL_TABLET | Freq: Every day | ORAL | Status: DC

## 2013-07-27 MED ORDER — BD LANCET ULTRAFINE 33G MISC: 1.0000 | Freq: Two times a day (BID) | Status: DC

## 2013-07-27 MED ORDER — GLUCOSE BLOOD VI STRP: ORAL_STRIP | Status: DC

## 2013-07-27 NOTE — Progress Notes (Signed)
Subjective:    Patient ID: Vincent [name], male    DOB: Oct 02, 1955, 58 y.o.   MRN: [id number]  HPI Increased OA pain with the weather Has DISH with chronic neck pain Weight issue Increased BP with pain and feels head throbbing and HR Is not on BB   Review of Systems  Constitutional: Negative for fever and fatigue.  HENT: Negative for congestion, hearing loss and postnasal drip.   Eyes: Negative for discharge, redness and visual disturbance.  Respiratory: Negative for cough, shortness of breath and wheezing.   Cardiovascular: Negative for leg swelling.  Gastrointestinal: Positive for constipation. Negative for abdominal pain and abdominal distention.  Genitourinary: Negative for urgency and frequency.  Musculoskeletal: Positive for arthralgias and joint swelling. Negative for neck pain.  Skin: Negative for color change and rash.  Neurological: Negative for weakness and light-headedness.  Hematological: Negative for adenopathy.  Psychiatric/Behavioral: Negative for behavioral problems.   Past Medical History  Diagnosis Date  . Anxiety   . Depression   . Gout   . DISH (diffuse idiopathic skeletal hyperostosis)   . Seasonal allergies   . Hyperlipidemia   . Kidney stone 2009    History   Social History  . Marital Status: Married    Spouse Name: N/A    Number of Children: N/A  . Years of Education: N/A   Occupational History  . Not on file.   Social History Main Topics  . Smoking status: Never Smoker   . Smokeless tobacco: Not on file  . Alcohol Use: Yes  . Drug Use: No  . Sexual Activity: Yes   Other Topics Concern  . Not on file   Social History Narrative  . No narrative on file    No past surgical history on file.  Family History  Problem Relation Age of Onset  . Hypertension Mother   . Cancer Mother     uterine and colon cancers  . Hypertension Sister   . Hypertension Maternal Grandmother   . Heart disease Father     Allergies  Allergen  Reactions  . Penicillins     Unknown- allergic to molds    Current Outpatient Prescriptions on File Prior to Visit  Medication Sig Dispense Refill  . ATIVAN 1 MG tablet Take 1 tablet (1 mg total) by mouth daily as needed for anxiety.  30 tablet  0  . azelastine (ASTELIN) 137 MCG/SPRAY nasal spray Place 1 spray into the nose as needed. Use in each nostril as directed  90 mL  3  . baclofen (LIORESAL) 10 MG tablet Take 10 mg by mouth 3 (three) times daily as needed (muscle spasms).       . butalbital-acetaminophen-caffeine (ESGIC) 50-325-40 MG per tablet Take 1 tablet by mouth 2 (two) times daily as needed for pain or headache.  60 tablet  0  . Butenafine HCl 1 % cream Apply topically 2 (two) times daily.      . candesartan-hydrochlorothiazide (ATACAND HCT) 32-12.5 MG per tablet Take 1 tablet by mouth daily.  90 tablet  3  . celecoxib (CELEBREX) 200 MG capsule Take 1 capsule (200 mg total) by mouth 2 (two) times daily.  180 capsule  3  . Cholecalciferol 50000 UNITS capsule Take 50,000 Units by mouth 2 (two) times a week.        . cyclobenzaprine (FLEXERIL) 10 MG tablet Take 10 mg by mouth 3 (three) times daily as needed.        . diclofenac sodium (VOLTAREN)  1 % GEL Apply 1 application topically as needed.  300 g  3  . DULoxetine (CYMBALTA) 60 MG capsule Take 1 capsule (60 mg total) by mouth daily.  90 capsule  3  . eletriptan (RELPAX) 40 MG tablet One tablet by mouth at onset of headache. May repeat in 2 hours if headache persists or recurs. may repeat in 2 hours if necessary  10 tablet  5  . esomeprazole (NEXIUM) 40 MG capsule Take 1 capsule (40 mg total) by mouth daily before breakfast.  90 capsule  3  . Exenatide ER (BYDUREON) 2 MG SUSR Inject 2 mg into the skin once a week. Sunday      . fluticasone (FLONASE) 50 MCG/ACT nasal spray Place 2 sprays into the nose daily. 2 sprays each nostril daily  48 g  3  . ketorolac (TORADOL) 10 MG tablet Take 1 tablet (10 mg total) by mouth every 6 (six)  hours as needed for pain. MAXIMUM 40 MG PER DAY  20 tablet  0  . Melatonin 3 MG CAPS Take 3 mg by mouth at bedtime as needed (for sleep).       . metFORMIN (GLUCOPHAGE) 500 MG tablet Take 1 tablet (500 mg total) by mouth 2 (two) times daily with a meal.  180 tablet  3  . morphine (MSIR) 15 MG tablet Take 1 tablet (15 mg total) by mouth every 4 (four) hours as needed for pain (breakthrough).  30 tablet  0  . NON FORMULARY Allergy Injections       . ondansetron (ZOFRAN) 4 MG tablet Take 1 tablet (4 mg total) by mouth every 6 (six) hours.  12 tablet  0  . oxyCODONE-acetaminophen (PERCOCET/ROXICET) 5-325 MG per tablet Take 1-2 tablets by mouth every 4 (four) hours as needed for pain.  15 tablet  0  . rosuvastatin (CRESTOR) 20 MG tablet Take 20 mg by mouth once a week.      . tadalafil (CIALIS) 20 MG tablet Take 20 mg by mouth daily as needed.        . Tapentadol HCl 75 MG TABS Take 1 tablet (75 mg total) by mouth 2 (two) times daily.  180 tablet  0  . traMADol (ULTRAM) 50 MG tablet Take 50 mg by mouth every 6 (six) hours as needed for pain.       No current facility-administered medications on file prior to visit.    BP 140/90  Pulse 84  Temp(Src) 97.9 F (36.6 C) (Oral)  Ht 5\' 11"  (1.803 m)  Wt 263 lb (119.296 kg)  BMI 36.70 kg/m2       Objective:   Physical Exam  Nursing note and vitals reviewed. HENT:  Head: Normocephalic and atraumatic.  Eyes: Conjunctivae are normal. Pupils are equal, round, and reactive to light.  Cardiovascular: Normal rate and regular rhythm.   Murmur heard. Abdominal: Soft. Bowel sounds are normal.  Musculoskeletal: He exhibits edema and tenderness.          Assessment & Plan:  Add toprol xl 25 For blood pressure and head aches  Titrate as needed to 50 if needed  crestor in connect with better diet and consistent meds has worked  CBG's are in the 140 range A1c in range Weight loss Exercise Limit gluten and sugars

## 2013-07-27 NOTE — Patient Instructions (Signed)
Added toprol

## 2013-07-27 NOTE — Progress Notes (Signed)
Pre visit review using our clinic review tool, if applicable. No additional management support is needed unless otherwise documented below in the visit note. 

## 2013-08-02 ENCOUNTER — Telehealth: Payer: Self-pay | Admitting: Internal Medicine

## 2013-08-02 MED ORDER — ELETRIPTAN HYDROBROMIDE 40 MG PO TABS: 40.0000 mg | ORAL_TABLET | ORAL | Status: DC | PRN

## 2013-08-02 NOTE — Telephone Encounter (Signed)
Pt req rx eletriptan (RELPAX) 40 MG tablet  Pt would like rx to be sent thru [facility]

## 2013-08-03 ENCOUNTER — Telehealth: Payer: Self-pay | Admitting: Internal Medicine

## 2013-08-03 MED ORDER — CELECOXIB 200 MG PO CAPS: 200.0000 mg | ORAL_CAPSULE | Freq: Two times a day (BID) | ORAL | Status: DC

## 2013-08-03 MED ORDER — CYCLOBENZAPRINE HCL 10 MG PO TABS: 10.0000 mg | ORAL_TABLET | Freq: Three times a day (TID) | ORAL | Status: DC | PRN

## 2013-08-03 MED ORDER — ELETRIPTAN HYDROBROMIDE 40 MG PO TABS: 40.0000 mg | ORAL_TABLET | ORAL | Status: DC | PRN

## 2013-08-03 NOTE — Telephone Encounter (Signed)
rx sent in electronically 

## 2013-08-03 NOTE — Addendum Note (Signed)
Addended by: Townsend Roger D on: 08/03/2013 08:41 AM   Modules accepted: Orders

## 2013-08-03 NOTE — Telephone Encounter (Signed)
[facility] (MAIL [facility] - [place], [place] is requesting 90 day re-fills on the following:   cyclobenzaprine (FLEXERIL) 10 MG tablet  celecoxib (CELEBREX) 200 MG capsule

## 2013-08-10 DIAGNOSIS — M7660 Achilles tendinitis, unspecified leg: Secondary | ICD-10-CM | POA: Diagnosis not present

## 2013-08-10 DIAGNOSIS — M47812 Spondylosis without myelopathy or radiculopathy, cervical region: Secondary | ICD-10-CM | POA: Diagnosis not present

## 2013-08-23 DIAGNOSIS — M47812 Spondylosis without myelopathy or radiculopathy, cervical region: Secondary | ICD-10-CM | POA: Diagnosis not present

## 2013-08-23 DIAGNOSIS — M7660 Achilles tendinitis, unspecified leg: Secondary | ICD-10-CM | POA: Diagnosis not present

## 2013-09-04 DIAGNOSIS — M47812 Spondylosis without myelopathy or radiculopathy, cervical region: Secondary | ICD-10-CM | POA: Diagnosis not present

## 2013-09-04 DIAGNOSIS — M7660 Achilles tendinitis, unspecified leg: Secondary | ICD-10-CM | POA: Diagnosis not present

## 2013-09-20 DIAGNOSIS — M7660 Achilles tendinitis, unspecified leg: Secondary | ICD-10-CM | POA: Diagnosis not present

## 2013-09-20 DIAGNOSIS — M47812 Spondylosis without myelopathy or radiculopathy, cervical region: Secondary | ICD-10-CM | POA: Diagnosis not present

## 2013-10-02 ENCOUNTER — Ambulatory Visit (HOSPITAL_COMMUNITY): Payer: Self-pay | Admitting: Psychiatry

## 2013-10-16 ENCOUNTER — Telehealth: Payer: Self-pay | Admitting: Internal Medicine

## 2013-10-16 MED ORDER — FLUTICASONE PROPIONATE 50 MCG/ACT NA SUSP: 2.0000 | Freq: Every day | NASAL | Status: DC

## 2013-10-16 NOTE — Telephone Encounter (Signed)
[facility] - [place], [place] is requesting 90 day re-fill on fluticasone (FLONASE) 50 MCG/ACT nasal spray

## 2013-10-16 NOTE — Telephone Encounter (Signed)
rx sent in electronically 

## 2013-10-18 DIAGNOSIS — M47812 Spondylosis without myelopathy or radiculopathy, cervical region: Secondary | ICD-10-CM | POA: Diagnosis not present

## 2013-10-18 DIAGNOSIS — M7660 Achilles tendinitis, unspecified leg: Secondary | ICD-10-CM | POA: Diagnosis not present

## 2013-11-01 DIAGNOSIS — M47812 Spondylosis without myelopathy or radiculopathy, cervical region: Secondary | ICD-10-CM | POA: Diagnosis not present

## 2013-11-01 DIAGNOSIS — M7660 Achilles tendinitis, unspecified leg: Secondary | ICD-10-CM | POA: Diagnosis not present

## 2013-11-06 ENCOUNTER — Encounter (HOSPITAL_COMMUNITY): Payer: Self-pay | Admitting: Psychiatry

## 2013-11-06 ENCOUNTER — Ambulatory Visit (INDEPENDENT_AMBULATORY_CARE_PROVIDER_SITE_OTHER): Payer: Medicare Other | Admitting: Psychiatry

## 2013-11-06 VITALS — BP 159/86 | HR 76 | Ht 71.0 in | Wt 261.4 lb

## 2013-11-06 DIAGNOSIS — F0630 Mood disorder due to known physiological condition, unspecified: Secondary | ICD-10-CM

## 2013-11-06 MED ORDER — ATIVAN 1 MG PO TABS: 1.0000 mg | ORAL_TABLET | Freq: Every day | ORAL | Status: DC | PRN

## 2013-11-06 NOTE — Progress Notes (Signed)
[facility] Progress Note  Vincent [name] [id number] 58 y.o.  11/06/2013 3:12 PM  Chief Complaint:  Medication management and followup.    History of Present Illness:  Vincent [name] came for his followup appointment.  He is taking his medication as prescribed.  He takes Ativan 1-2 times every week.  He likes to Cymbalta.  He continues to have some anxiety and nervousness but overall his mood has been stable.  Recently he visited [place] and had a good trip.  He is sleeping good.  His appetite is okay.  He has no side effects of medication.  He hasn't agitation, anger or any mood swings.  He is going to see a new panic or physician Dr. Damita Dunnings in September.  Patient lives with his wife who is very supportive.  He has no children.  Suicidal Ideation: No Plan Formed: No Patient has means to carry out plan: No  Homicidal Ideation: No Plan Formed: No Patient has means to carry out plan: No  Review of Systems: Psychiatric: Agitation: No Hallucination: No Depressed Mood: No Insomnia: No Hypersomnia: No Altered Concentration: No Feels Worthless: No Grandiose Ideas: No Belief In Special Powers: No New/Increased Substance Abuse: No Compulsions: No  Neurologic: Headache: No Seizure: No Paresthesias: Chronic pain and numbness.  Complain of back pain and joint pain.  Medical History;  Patient has a history of gout which is controlled by diet.  He has diffuse idiopathic skeletal hyperostosis, diabetes mellitus , seasonal allergies, hyperlipidemia, chronic back pain, osteoporosis, hypertension and recently kidney stones.      Outpatient Encounter Prescriptions as of 11/06/2013  Medication Sig  . ATIVAN 1 MG tablet Take 1 tablet (1 mg total) by mouth daily as needed for anxiety.  Marland Kitchen azelastine (ASTELIN) 137 MCG/SPRAY nasal spray Place 1 spray into the nose as needed. Use in each nostril as directed  . B-D ULTRA-FINE 33 LANCETS MISC 1 each by Does not apply route 2 (two)  times daily at 8 am and 10 pm.  . baclofen (LIORESAL) 10 MG tablet Take 10 mg by mouth 3 (three) times daily as needed (muscle spasms).   . butalbital-acetaminophen-caffeine (ESGIC) 50-325-40 MG per tablet Take 1 tablet by mouth 2 (two) times daily as needed for pain or headache.  . Butenafine HCl 1 % cream Apply topically 2 (two) times daily.  . candesartan-hydrochlorothiazide (ATACAND HCT) 32-12.5 MG per tablet Take 1 tablet by mouth daily.  . celecoxib (CELEBREX) 200 MG capsule Take 1 capsule (200 mg total) by mouth 2 (two) times daily.  . Cholecalciferol 50000 UNITS capsule Take 50,000 Units by mouth 2 (two) times a week.    . cyclobenzaprine (FLEXERIL) 10 MG tablet Take 1 tablet (10 mg total) by mouth 3 (three) times daily as needed.  . diclofenac sodium (VOLTAREN) 1 % GEL Apply 1 application topically as needed.  . DULoxetine (CYMBALTA) 60 MG capsule Take 1 capsule (60 mg total) by mouth daily.  Marland Kitchen eletriptan (RELPAX) 40 MG tablet Take 1 tablet (40 mg total) by mouth as needed for migraine. may repeat in 2 hours if necessary  . esomeprazole (NEXIUM) 40 MG capsule Take 1 capsule (40 mg total) by mouth daily before breakfast.  . Exenatide ER (BYDUREON) 2 MG SUSR Inject 2 mg into the skin once a week. Sunday  . fluticasone (FLONASE) 50 MCG/ACT nasal spray Place 2 sprays into both nostrils daily. 2 sprays each nostril daily  . glucose blood test strip Use as instructed  . ketorolac (TORADOL)  10 MG tablet Take 1 tablet (10 mg total) by mouth every 6 (six) hours as needed for pain. MAXIMUM 40 MG PER DAY  . Melatonin 3 MG CAPS Take 3 mg by mouth at bedtime as needed (for sleep).   . metFORMIN (GLUCOPHAGE) 500 MG tablet Take 1 tablet (500 mg total) by mouth 2 (two) times daily with a meal.  . metoprolol succinate (TOPROL-XL) 25 MG 24 hr tablet Take 1 tablet (25 mg total) by mouth daily.  Marland Kitchen morphine (MSIR) 15 MG tablet Take 1 tablet (15 mg total) by mouth every 4 (four) hours as needed for pain  (breakthrough).  . NON FORMULARY Allergy Injections   . ondansetron (ZOFRAN) 4 MG tablet Take 1 tablet (4 mg total) by mouth every 6 (six) hours.  Marland Kitchen oxyCODONE-acetaminophen (PERCOCET/ROXICET) 5-325 MG per tablet Take 1-2 tablets by mouth every 4 (four) hours as needed for pain.  . rosuvastatin (CRESTOR) 20 MG tablet Take 20 mg by mouth once a week.  . tadalafil (CIALIS) 20 MG tablet Take 20 mg by mouth daily as needed.    . Tapentadol HCl 75 MG TABS Take 1 tablet (75 mg total) by mouth 2 (two) times daily.  . traMADol (ULTRAM) 50 MG tablet Take 50 mg by mouth every 6 (six) hours as needed for pain.  . [DISCONTINUED] ATIVAN 1 MG tablet Take 1 tablet (1 mg total) by mouth daily as needed for anxiety.    No results found for this or any previous visit (from the past 2160 hour(s)).  Past Psychiatric History/Hospitalization(s) Patient denies any previous history of psychiatric inpatient treatment or any suicidal attempt.  He has history of anxiety and depression mostly due to his chronic back pain and neck pain.  In the past he has taken Paxil and Neurontin however he liked Cymbalta better.  Patient denies any history of paranoia, hallucination, mania, psychosis or any agitation. Anxiety: Yes Bipolar Disorder: No Depression: No Mania: No Psychosis: No Schizophrenia: No Personality Disorder: No Hospitalization for psychiatric illness: No History of Electroconvulsive Shock Therapy: No Prior Suicide Attempts: No  Physical Exam: Constitutional:  BP 159/86  Pulse 76  Ht 5\' 11"  (1.803 m)  Wt 261 lb 6.4 oz (118.57 kg)  BMI 36.47 kg/m2  Musculoskeletal: Strength & Muscle Tone: spastic Gait & Station: Using a cane to walk because of pain Patient leans: Front  Mental Status Examination;  Patient is casually dressed and fairly groomed. He is calm and cooperative. He maintained good eye contact. His speech is slow with normal tone and volume. His thought processes logical and goal-directed. He  denies any active or passive suicidal thoughts or homicidal thoughts. His attention and concentration is fair. He denies any auditory or visual hallucination. His fund of knowledge is adequate. There no psychotic symptoms present. He's alert and oriented x3. He described his mood as down his affect is constricted. 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 Anxiety   . Depression   . Gout   . DISH (diffuse idiopathic skeletal hyperostosis)   . Seasonal allergies   . Hyperlipidemia   . Kidney stone 2009    Axis IV: Mild   Plan:  Patient is doing better  on his current Cymbalta.  I will continue Cymbalta 60 mg daily.  He was given enough refills by his primary care physician and he may not needed a new prescription until December 2015.  A new position of Ativan with one more additional refills was given.  Recommended to call us back if he has any question or any concern.  Followup in 6 months.  Trenton Passow T., MD 11/06/2013

## 2013-11-15 DIAGNOSIS — M47812 Spondylosis without myelopathy or radiculopathy, cervical region: Secondary | ICD-10-CM | POA: Diagnosis not present

## 2013-11-15 DIAGNOSIS — M7660 Achilles tendinitis, unspecified leg: Secondary | ICD-10-CM | POA: Diagnosis not present

## 2013-11-26 DIAGNOSIS — M47812 Spondylosis without myelopathy or radiculopathy, cervical region: Secondary | ICD-10-CM | POA: Diagnosis not present

## 2013-11-26 DIAGNOSIS — M7660 Achilles tendinitis, unspecified leg: Secondary | ICD-10-CM | POA: Diagnosis not present

## 2013-12-13 DIAGNOSIS — M47812 Spondylosis without myelopathy or radiculopathy, cervical region: Secondary | ICD-10-CM | POA: Diagnosis not present

## 2013-12-27 ENCOUNTER — Other Ambulatory Visit: Payer: Self-pay | Admitting: Internal Medicine

## 2013-12-27 DIAGNOSIS — M545 Low back pain, unspecified: Secondary | ICD-10-CM

## 2013-12-27 DIAGNOSIS — M47812 Spondylosis without myelopathy or radiculopathy, cervical region: Secondary | ICD-10-CM | POA: Diagnosis not present

## 2014-01-01 ENCOUNTER — Telehealth: Payer: Self-pay | Admitting: *Deleted

## 2014-01-01 MED ORDER — MORPHINE SULFATE 15 MG PO TABS: 15.0000 mg | ORAL_TABLET | ORAL | Status: DC | PRN

## 2014-01-01 NOTE — Telephone Encounter (Signed)
mychart message sent to pt that he can pick up morphine rx's

## 2014-01-10 DIAGNOSIS — M47812 Spondylosis without myelopathy or radiculopathy, cervical region: Secondary | ICD-10-CM | POA: Diagnosis not present

## 2014-01-18 DIAGNOSIS — Z23 Encounter for immunization: Secondary | ICD-10-CM | POA: Diagnosis not present

## 2014-01-24 DIAGNOSIS — M47812 Spondylosis without myelopathy or radiculopathy, cervical region: Secondary | ICD-10-CM | POA: Diagnosis not present

## 2014-02-07 DIAGNOSIS — M47812 Spondylosis without myelopathy or radiculopathy, cervical region: Secondary | ICD-10-CM | POA: Diagnosis not present

## 2014-02-10 DIAGNOSIS — M47812 Spondylosis without myelopathy or radiculopathy, cervical region: Secondary | ICD-10-CM | POA: Diagnosis not present

## 2014-02-12 ENCOUNTER — Telehealth (HOSPITAL_COMMUNITY): Payer: Self-pay | Admitting: *Deleted

## 2014-02-12 NOTE — Telephone Encounter (Signed)
Patient left VM: Requests excuse letter from [facility] due to current illness/condition. Juror # [id number], Report date Thursday January 7th, 2016

## 2014-02-13 NOTE — Telephone Encounter (Addendum)
Per Dr. Adele Schilder, may provide letter excusing pt from jury duty.  Phoned patient - left message: Notified pt that letter is completed and may pick up at office or may be mailed to his home. Requested pt call office with preference

## 2014-02-20 DIAGNOSIS — M47812 Spondylosis without myelopathy or radiculopathy, cervical region: Secondary | ICD-10-CM | POA: Diagnosis not present

## 2014-02-26 ENCOUNTER — Encounter: Payer: Self-pay | Admitting: Family Medicine

## 2014-02-26 ENCOUNTER — Ambulatory Visit (INDEPENDENT_AMBULATORY_CARE_PROVIDER_SITE_OTHER): Payer: Medicare Other | Admitting: Family Medicine

## 2014-02-26 VITALS — BP 152/92 | HR 85 | Temp 97.8°F | Ht 71.0 in | Wt 259.5 lb

## 2014-02-26 DIAGNOSIS — K219 Gastro-esophageal reflux disease without esophagitis: Secondary | ICD-10-CM | POA: Diagnosis not present

## 2014-02-26 DIAGNOSIS — M4810 Ankylosing hyperostosis [Forestier], site unspecified: Secondary | ICD-10-CM | POA: Diagnosis not present

## 2014-02-26 DIAGNOSIS — F3289 Other specified depressive episodes: Secondary | ICD-10-CM

## 2014-02-26 DIAGNOSIS — E119 Type 2 diabetes mellitus without complications: Secondary | ICD-10-CM

## 2014-02-26 DIAGNOSIS — G43009 Migraine without aura, not intractable, without status migrainosus: Secondary | ICD-10-CM | POA: Diagnosis not present

## 2014-02-26 DIAGNOSIS — IMO0002 Reserved for concepts with insufficient information to code with codable children: Secondary | ICD-10-CM

## 2014-02-26 DIAGNOSIS — E785 Hyperlipidemia, unspecified: Secondary | ICD-10-CM

## 2014-02-26 DIAGNOSIS — F328 Other depressive episodes: Secondary | ICD-10-CM | POA: Diagnosis not present

## 2014-02-26 DIAGNOSIS — F39 Unspecified mood [affective] disorder: Secondary | ICD-10-CM | POA: Diagnosis not present

## 2014-02-26 DIAGNOSIS — I10 Essential (primary) hypertension: Secondary | ICD-10-CM

## 2014-02-26 DIAGNOSIS — E1165 Type 2 diabetes mellitus with hyperglycemia: Secondary | ICD-10-CM

## 2014-02-26 DIAGNOSIS — F0630 Mood disorder due to known physiological condition, unspecified: Secondary | ICD-10-CM

## 2014-02-26 MED ORDER — CELECOXIB 200 MG PO CAPS: 200.0000 mg | ORAL_CAPSULE | Freq: Two times a day (BID) | ORAL | Status: DC

## 2014-02-26 MED ORDER — METFORMIN HCL 500 MG PO TABS: 500.0000 mg | ORAL_TABLET | Freq: Two times a day (BID) | ORAL | Status: DC

## 2014-02-26 MED ORDER — DULOXETINE HCL 60 MG PO CPEP: 60.0000 mg | ORAL_CAPSULE | Freq: Every day | ORAL | Status: DC

## 2014-02-26 MED ORDER — METOPROLOL SUCCINATE ER 50 MG PO TB24: 50.0000 mg | ORAL_TABLET | Freq: Every day | ORAL | Status: DC

## 2014-02-26 MED ORDER — TAPENTADOL HCL 75 MG PO TABS: 75.0000 mg | ORAL_TABLET | Freq: Two times a day (BID) | ORAL | Status: DC

## 2014-02-26 MED ORDER — CANDESARTAN CILEXETIL-HCTZ 32-12.5 MG PO TABS: 1.0000 | ORAL_TABLET | Freq: Every day | ORAL | Status: DC

## 2014-02-26 MED ORDER — ESOMEPRAZOLE MAGNESIUM 40 MG PO CPDR: 40.0000 mg | DELAYED_RELEASE_CAPSULE | Freq: Every day | ORAL | Status: DC

## 2014-02-26 NOTE — Patient Instructions (Addendum)
Go to the lab on the way out.  We'll contact you with your lab report. Thanks for getting a flu shot.  Plan on lab visit in 3 months with office visit a few days later- ask for a 30 minute visit.  Take care.  Glad to see you.

## 2014-02-26 NOTE — Progress Notes (Signed)
Pre visit review using our clinic review tool, if applicable. No additional management support is needed unless otherwise documented below in the visit note.  DISH. Has seen [facility], rheum w/o prev done,  B27 neg.  In PT at [facility] ortho chronically.  Has sen Dr. Bjorn Pippin, w/o help from injections.  Takes celebrex BID, then uses flexeril prn, followed by nucynta then morphine. Tries to limit nucynta and morphine.  In chronic pain, worse with weather changes.   Sig dec in neck ROM noted.   Panic attacks and depression likely worse with chronic pain.  No Si/Hi, has had psych f/u.  Still okay for outpatient fu. No ADE on meds.  Compliant with meds.   Diabetes:  Using medications without difficulties:yes Hypoglycemic episodes:no Hyperglycemic episodes:yes, up to ~200 Feet problems:no new tingling.   Blood Sugars averaging: see above Due for A1c.   Hypertension:    Using medication without problems or lightheadedness: yes Chest pain with exertion:no Edema:no Short of breath:no Average home BPs: had been elevated on home checks.   Elevated Cholesterol: Using medications without problems:yes Muscle aches: not from statin. Diet compliance: encouraged Exercise: limited.  PMH and SH reviewed.   Vital signs, Meds and allergies reviewed.  ROS: See HPI.  Otherwise nontributory.   GEN: nad, alert and oriented, uncomfortable from pain in neck HEENT: mucous membranes moist NECK: dec ROM at baseline noted, w/o LA CV: rrr PULM: ctab, no inc wob ABD: soft, +bs EXT: no edema SKIN: no acute rash

## 2014-02-27 ENCOUNTER — Telehealth: Payer: Self-pay | Admitting: Internal Medicine

## 2014-02-27 LAB — COMPREHENSIVE METABOLIC PANEL
ALT: 24 U/L (ref 0–53)
AST: 23 U/L (ref 0–37)
Albumin: 4.2 g/dL (ref 3.5–5.2)
Alkaline Phosphatase: 70 U/L (ref 39–117)
BILIRUBIN TOTAL: 0.7 mg/dL (ref 0.2–1.2)
BUN: 25 mg/dL — ABNORMAL HIGH (ref 6–23)
CO2: 28 mEq/L (ref 19–32)
CREATININE: 1.2 mg/dL (ref 0.4–1.5)
Calcium: 8.8 mg/dL (ref 8.4–10.5)
Chloride: 100 mEq/L (ref 96–112)
GFR: 65.3 mL/min (ref >60.00)
Glucose, Bld: 191 mg/dL — ABNORMAL HIGH (ref 70–99)
Potassium: 4.4 mEq/L (ref 3.5–5.1)
Sodium: 134 mEq/L — ABNORMAL LOW (ref 135–145)
Total Protein: 7.3 g/dL (ref 6.0–8.3)

## 2014-02-27 NOTE — Telephone Encounter (Signed)
emmi emailed °

## 2014-02-28 ENCOUNTER — Encounter: Payer: Self-pay | Admitting: Family Medicine

## 2014-02-28 LAB — HEMOGLOBIN A1C: Hgb A1c MFr Bld: 8.2 % — ABNORMAL HIGH (ref 4.6–6.5)

## 2014-02-28 NOTE — Assessment & Plan Note (Signed)
Has seen [facility], rheum w/u prev done,  B27 neg.  In PT at [facility] ortho chronically.  Has sen Dr. Bjorn Pippin, w/o help from injections.  Takes celebrex BID, then uses flexeril prn, followed by nucynta then morphine. Tries to limit nucynta and morphine.  In chronic pain, worse with weather changes.   Sig dec in neck ROM noted.  No change in meds at this point.  >40 minutes spent in face to face time with patient, >50% spent in counselling or coordination of care

## 2014-02-28 NOTE — Assessment & Plan Note (Signed)
See notes on labs.  meds d/w pt.  We can't really change his meds until I have his labs back.  He understood.

## 2014-02-28 NOTE — Assessment & Plan Note (Signed)
Continue as is for now, until I can see his labs.  His BB was increased to 50mg .

## 2014-02-28 NOTE — Assessment & Plan Note (Signed)
Continue statin. D/w pt. No ADE on med.

## 2014-02-28 NOTE — Assessment & Plan Note (Signed)
Panic attacks and depression likely worse with chronic pain.  No Si/Hi, has had psych f/u.  Still okay for outpatient fu. No ADE on meds.  Compliant with meds.

## 2014-03-04 ENCOUNTER — Other Ambulatory Visit: Payer: Self-pay | Admitting: Family Medicine

## 2014-03-04 MED ORDER — METFORMIN HCL 500 MG PO TABS: 500.0000 mg | ORAL_TABLET | Freq: Two times a day (BID) | ORAL | Status: DC

## 2014-03-07 ENCOUNTER — Encounter: Payer: Self-pay | Admitting: Family Medicine

## 2014-03-07 DIAGNOSIS — M47812 Spondylosis without myelopathy or radiculopathy, cervical region: Secondary | ICD-10-CM | POA: Diagnosis not present

## 2014-04-04 DIAGNOSIS — M47812 Spondylosis without myelopathy or radiculopathy, cervical region: Secondary | ICD-10-CM | POA: Diagnosis not present

## 2014-04-15 ENCOUNTER — Encounter: Payer: Self-pay | Admitting: Family Medicine

## 2014-04-16 ENCOUNTER — Other Ambulatory Visit: Payer: Self-pay | Admitting: Family Medicine

## 2014-04-16 MED ORDER — METFORMIN HCL 500 MG PO TABS: 500.0000 mg | ORAL_TABLET | Freq: Two times a day (BID) | ORAL | Status: DC

## 2014-04-18 DIAGNOSIS — M47812 Spondylosis without myelopathy or radiculopathy, cervical region: Secondary | ICD-10-CM | POA: Diagnosis not present

## 2014-04-25 DIAGNOSIS — M47812 Spondylosis without myelopathy or radiculopathy, cervical region: Secondary | ICD-10-CM | POA: Diagnosis not present

## 2014-05-16 DIAGNOSIS — M47812 Spondylosis without myelopathy or radiculopathy, cervical region: Secondary | ICD-10-CM | POA: Diagnosis not present

## 2014-05-21 ENCOUNTER — Ambulatory Visit (HOSPITAL_COMMUNITY): Payer: Self-pay | Admitting: Psychiatry

## 2014-05-23 ENCOUNTER — Other Ambulatory Visit: Payer: Self-pay | Admitting: Family Medicine

## 2014-05-23 DIAGNOSIS — E1165 Type 2 diabetes mellitus with hyperglycemia: Secondary | ICD-10-CM

## 2014-05-23 DIAGNOSIS — IMO0002 Reserved for concepts with insufficient information to code with codable children: Secondary | ICD-10-CM

## 2014-05-27 ENCOUNTER — Other Ambulatory Visit (INDEPENDENT_AMBULATORY_CARE_PROVIDER_SITE_OTHER): Payer: Medicare Other

## 2014-05-27 DIAGNOSIS — E1165 Type 2 diabetes mellitus with hyperglycemia: Secondary | ICD-10-CM

## 2014-05-27 DIAGNOSIS — IMO0002 Reserved for concepts with insufficient information to code with codable children: Secondary | ICD-10-CM

## 2014-05-27 LAB — HEMOGLOBIN A1C: HEMOGLOBIN A1C: 7.1 % — AB (ref 4.6–6.5)

## 2014-05-29 ENCOUNTER — Encounter: Payer: Self-pay | Admitting: Family Medicine

## 2014-05-29 ENCOUNTER — Ambulatory Visit (INDEPENDENT_AMBULATORY_CARE_PROVIDER_SITE_OTHER): Payer: Medicare Other | Admitting: Family Medicine

## 2014-05-29 VITALS — BP 140/88 | HR 76 | Temp 98.4°F | Wt 258.0 lb

## 2014-05-29 DIAGNOSIS — K219 Gastro-esophageal reflux disease without esophagitis: Secondary | ICD-10-CM

## 2014-05-29 DIAGNOSIS — I10 Essential (primary) hypertension: Secondary | ICD-10-CM | POA: Diagnosis not present

## 2014-05-29 DIAGNOSIS — Z23 Encounter for immunization: Secondary | ICD-10-CM

## 2014-05-29 DIAGNOSIS — F39 Unspecified mood [affective] disorder: Secondary | ICD-10-CM | POA: Diagnosis not present

## 2014-05-29 DIAGNOSIS — IMO0002 Reserved for concepts with insufficient information to code with codable children: Secondary | ICD-10-CM

## 2014-05-29 DIAGNOSIS — F0630 Mood disorder due to known physiological condition, unspecified: Secondary | ICD-10-CM

## 2014-05-29 DIAGNOSIS — G43009 Migraine without aura, not intractable, without status migrainosus: Secondary | ICD-10-CM | POA: Diagnosis not present

## 2014-05-29 DIAGNOSIS — E1165 Type 2 diabetes mellitus with hyperglycemia: Secondary | ICD-10-CM | POA: Diagnosis not present

## 2014-05-29 MED ORDER — ESOMEPRAZOLE MAGNESIUM 40 MG PO CPDR: 40.0000 mg | DELAYED_RELEASE_CAPSULE | Freq: Every day | ORAL | Status: DC

## 2014-05-29 MED ORDER — CELECOXIB 200 MG PO CAPS: 200.0000 mg | ORAL_CAPSULE | Freq: Two times a day (BID) | ORAL | Status: DC

## 2014-05-29 MED ORDER — METFORMIN HCL 500 MG PO TABS: ORAL_TABLET | ORAL | Status: DC

## 2014-05-29 MED ORDER — CANDESARTAN CILEXETIL-HCTZ 32-12.5 MG PO TABS: 1.0000 | ORAL_TABLET | Freq: Every day | ORAL | Status: DC

## 2014-05-29 MED ORDER — METOPROLOL SUCCINATE ER 50 MG PO TB24: ORAL_TABLET | ORAL | Status: DC

## 2014-05-29 MED ORDER — DULOXETINE HCL 60 MG PO CPEP: 60.0000 mg | ORAL_CAPSULE | Freq: Every day | ORAL | Status: DC

## 2014-05-29 MED ORDER — FLUTICASONE PROPIONATE 50 MCG/ACT NA SUSP: 2.0000 | Freq: Every day | NASAL | Status: DC

## 2014-05-29 NOTE — Patient Instructions (Addendum)
Call about an eye exam.   recheck in about 6 months at a physical, labs ahead of time.  Keep working on [employer].  Glad to see you.  Take care.  Take an extra half a metoprolol when needed.

## 2014-05-29 NOTE — Progress Notes (Signed)
Pre visit review using our clinic review tool, if applicable. No additional management support is needed unless otherwise documented below in the visit note.  Diabetes:  Using medications without difficulties:yes, max tolerated dose of metformin 1500mg  a day.  Diarrhea at higher dose.  Hypoglycemic episodes:no Hyperglycemic episodes:no Feet problems: no.   Blood Sugars averaging: usually ~120, up a little more recently on lower dose of metformin.   eye exam within last year: due, d/w pt.   A1c improved, d/w pt.   He has been working on diet and with exercise as tolerated.  He is still in PT.  HTN.  Inc in BP and pulse with times of anxiety.  Compliant with meds.  Inc in BP with inc in pain.  He is usually able to control pain w/o escalating his doses/frequency. We talked about an extra prn metoprolol dose if BP up or pulse up.    His neck pain continues to vary with the weather.    PMH and SH reviewed  Meds, vitals, and allergies reviewed.   ROS: See HPI.  Otherwise negative.    GEN: nad, alert and oriented HEENT: mucous membranes moist NECK: dec ROM at baseline w/o LA CV: rrr. PULM: ctab, no inc wob ABD: soft, +bs EXT: no edema SKIN: no acute rash  Diabetic foot exam: Normal inspection No skin breakdown No calluses  Normal DP pulses Normal sensation to light touch and monofilament Nails thickened.

## 2014-05-30 ENCOUNTER — Encounter: Payer: Self-pay | Admitting: Family Medicine

## 2014-05-30 NOTE — Assessment & Plan Note (Signed)
Inc in BP and pulse with times of anxiety.  Compliant with meds.  Inc in BP with inc in pain.  He is usually able to control pain w/o escalating his doses/frequency. We talked about an extra prn metoprolol dose if BP up or pulse up, he'll do that and notify me prn.  He agrees.

## 2014-05-30 NOTE — Assessment & Plan Note (Signed)
Still >7 but much improved.  Continue meds as is and recheck in about 6 months.  He'll continue work on diet and exercise.  He agrees with plan.  >25 minutes spent in face to face time with patient, >50% spent in counselling or coordination of care.

## 2014-06-04 ENCOUNTER — Encounter (HOSPITAL_COMMUNITY): Payer: Self-pay | Admitting: Psychiatry

## 2014-06-04 ENCOUNTER — Ambulatory Visit (INDEPENDENT_AMBULATORY_CARE_PROVIDER_SITE_OTHER): Payer: Medicare Other | Admitting: Psychiatry

## 2014-06-04 DIAGNOSIS — F411 Generalized anxiety disorder: Secondary | ICD-10-CM | POA: Diagnosis not present

## 2014-06-04 DIAGNOSIS — F0630 Mood disorder due to known physiological condition, unspecified: Secondary | ICD-10-CM

## 2014-06-04 DIAGNOSIS — F39 Unspecified mood [affective] disorder: Secondary | ICD-10-CM | POA: Diagnosis not present

## 2014-06-04 MED ORDER — BUSPIRONE HCL 5 MG PO TABS: 5.0000 mg | ORAL_TABLET | Freq: Two times a day (BID) | ORAL | Status: DC

## 2014-06-04 MED ORDER — ATIVAN 1 MG PO TABS: 1.0000 mg | ORAL_TABLET | Freq: Every day | ORAL | Status: DC | PRN

## 2014-06-04 NOTE — Progress Notes (Signed)
[facility] Progress Note  Vincent [name] [id number] 59 y.o.  06/04/2014 3:42 PM  Chief Complaint:  I am feeling more anxious and nervous.  I want to try BuSpar.      History of Present Illness:  Vincent [name] came for his followup appointment.  He was seen 6 months ago and in past 6 months he's been doing fine until recently he has noticed increased anxiety and nervousness.  In December patient and his wife went to [place] thinking about relocating however his wife did not like that he wanted heart rather.  The patient like [place] because of his chronic pain.  Now he is hoping to go [place] as both wants to move there in the future.  Patient told recently he has put his house on the market and there has been a lot of anxiety and is feeling overwhelmed.  He admitted taking Ativan more than usual but he like to take something on a regular basis to help his anxiety.  He is complying the Cymbalta which was recently filled by his primary care physician.  Patient is sleeping okay.  He denies any agitation, anger, mood swing but admitted nervousness and very anxious.  He also complaining of chronic pain which usually gets worst in winter and that's one of the reason he wants to relocate in different state.  Patient was recently seen by his primary care physician Dr. Damita Dunnings and is happy that his and lipid A1c is dropped.  He has a low blood work.  Patient denies any changes in his appetite.  His vitals are okay.  Patient denies drinking or using any illegal substances.  Patient lives with his wife who is nervous and very supportive.  He has no children.  Suicidal Ideation: No Plan Formed: No Patient has means to carry out plan: No  Homicidal Ideation: No Plan Formed: No Patient has means to carry out plan: No  Review of Systems  Constitutional: Negative.   Musculoskeletal: Positive for back pain and neck pain.  Psychiatric/Behavioral: Negative for hallucinations and substance abuse.  The patient is nervous/anxious.        Anxiety     Psychiatric: Agitation: No Hallucination: No Depressed Mood: No Insomnia: No Hypersomnia: No Altered Concentration: No Feels Worthless: No Grandiose Ideas: No Belief In Special Powers: No New/Increased Substance Abuse: No Compulsions: No  Neurologic: Headache: No Seizure: No Paresthesias: Chronic pain and numbness.  Complain of back pain and joint pain.  Medical History;  Patient has a history of gout which is controlled by diet.  He has diffuse idiopathic skeletal hyperostosis, diabetes mellitus , seasonal allergies, hyperlipidemia, chronic back pain, osteoporosis, hypertension and recently kidney stones.      Outpatient Encounter Prescriptions as of 06/04/2014  Medication Sig  . ATIVAN 1 MG tablet Take 1 tablet (1 mg total) by mouth daily as needed for anxiety.  Marland Kitchen azelastine (ASTELIN) 137 MCG/SPRAY nasal spray Place 1 spray into the nose as needed. Use in each nostril as directed  . B-D ULTRA-FINE 33 LANCETS MISC 1 each by Does not apply route 2 (two) times daily at 8 am and 10 pm.  . busPIRone (BUSPAR) 5 MG tablet Take 1 tablet (5 mg total) by mouth 2 (two) times daily.  . butalbital-acetaminophen-caffeine (ESGIC) 50-325-40 MG per tablet Take 1 tablet by mouth 2 (two) times daily as needed for pain or headache.  . Butenafine HCl 1 % cream Apply topically 2 (two) times daily.  . candesartan-hydrochlorothiazide (ATACAND HCT) 32-12.5  MG per tablet Take 1 tablet by mouth daily.  . celecoxib (CELEBREX) 200 MG capsule Take 1 capsule (200 mg total) by mouth 2 (two) times daily.  . cetirizine (ZYRTEC) 10 MG tablet Take 10 mg by mouth daily.  . cyclobenzaprine (FLEXERIL) 10 MG tablet Take 1 tablet (10 mg total) by mouth 3 (three) times daily as needed.  . diclofenac sodium (VOLTAREN) 1 % GEL Apply 1 application topically as needed.  . DULoxetine (CYMBALTA) 60 MG capsule Take 1 capsule (60 mg total) by mouth daily.  Marland Kitchen eletriptan  (RELPAX) 40 MG tablet Take 1 tablet (40 mg total) by mouth as needed for migraine. may repeat in 2 hours if necessary  . esomeprazole (NEXIUM) 40 MG capsule Take 1 capsule (40 mg total) by mouth daily before breakfast.  . fluticasone (FLONASE) 50 MCG/ACT nasal spray Place 2 sprays into both nostrils daily. 2 sprays each nostril daily  . glucose blood test strip Use as instructed (Patient taking differently: Bayer Contour. Use as instructed)  . Melatonin 3 MG CAPS Take 3 mg by mouth at bedtime as needed (for sleep).   . metFORMIN (GLUCOPHAGE) 500 MG tablet 2 tabs in AM and 1 tab in PM.  Total 1500mg  a day.  . metoprolol succinate (TOPROL-XL) 50 MG 24 hr tablet 1 tab a day.  Take extra 1/2 tab if BP is elevated.  Marland Kitchen morphine (MSIR) 15 MG tablet Take 1 tablet (15 mg total) by mouth every 4 (four) hours as needed.  . rosuvastatin (CRESTOR) 20 MG tablet Take 20 mg by mouth once a week.  . tadalafil (CIALIS) 20 MG tablet Take 20 mg by mouth daily as needed.    . Tapentadol HCl 75 MG TABS Take 1 tablet (75 mg total) by mouth 2 (two) times daily.  . [DISCONTINUED] ATIVAN 1 MG tablet Take 1 tablet (1 mg total) by mouth daily as needed for anxiety.    Recent Results (from the past 2160 hour(s))  Hemoglobin A1c     Status: Abnormal   Collection Time: 05/27/14 11:16 AM  Result Value Ref Range   Hgb A1c MFr Bld 7.1 (H) 4.6 - 6.5 %    Comment: Glycemic Control Guidelines for People with Diabetes:Non Diabetic:  <6%Goal of Therapy: <7%Additional Action Suggested:  >8%     Past Psychiatric History/Hospitalization(s) Patient denies any previous history of psychiatric inpatient treatment or any suicidal attempt.  He has history of anxiety and depression mostly due to his chronic back pain and neck pain.  In the past he has taken Paxil and Neurontin however he liked Cymbalta better.  Patient denies any history of paranoia, hallucination, mania, psychosis or any agitation. Anxiety: Yes Bipolar Disorder:  No Depression: No Mania: No Psychosis: No Schizophrenia: No Personality Disorder: No Hospitalization for psychiatric illness: No History of Electroconvulsive Shock Therapy: No Prior Suicide Attempts: No  Physical Exam: Constitutional:  There were no vitals taken for this visit.  Recent Results (from the past 2160 hour(s))  Hemoglobin A1c     Status: Abnormal   Collection Time: 05/27/14 11:16 AM  Result Value Ref Range   Hgb A1c MFr Bld 7.1 (H) 4.6 - 6.5 %    Comment: Glycemic Control Guidelines for People with Diabetes:Non Diabetic:  <6%Goal of Therapy: <7%Additional Action Suggested:  >8%     Musculoskeletal: Strength & Muscle Tone: spastic Gait & Station: Using a cane to walk because of pain Patient leans: Front  Mental Status Examination;  Patient is casually dressed and fairly  groomed. He is calm and cooperative.  He described his mood anxious and his affect is appropriate.  He maintained good eye contact. His speech is slow with normal tone and volume. His thought processes logical and goal-directed. He denies any active or passive suicidal thoughts or homicidal thoughts. His attention and concentration is fair. He denies any auditory or visual hallucination. His fund of knowledge is adequate. There no psychotic symptoms present. He's alert and oriented x3.  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 Anxiety   . Depression   . Gout   . DISH (diffuse idiopathic skeletal hyperostosis)     prev rheum w/u done at [facility]   . Seasonal allergies   .  Hyperlipidemia   . Kidney stone 2009  . Diabetes mellitus without complication   . GERD (gastroesophageal reflux disease)   . Panic   . Diverticulitis    Plan:  Patient is getting Cymbalta which was prescribed by his primary care physician.  He is using Ativan only for severe anxiety and panic attack.  We will start BuSpar 5 mg twice a day to help his anxiety and nervousness.  He does not want to take Ativan every day because of dependency issue.  He is in the process of moving [place] and his house is on market.  Patient told he is going to [place] in May with his wife.  I discussed medication side effects and recommended to call us back if he has any question or any concern.  I will see him again in 3 months. Time spent 25 minutes.  More than 50% of the time spent in psychoeducation, counseling and coordination of care.  Discuss safety plan that anytime having active suicidal thoughts or homicidal thoughts then patient need to call 911 or go to the local emergency room.   Camren Henthorn T., MD 06/04/2014

## 2014-06-26 DIAGNOSIS — M47812 Spondylosis without myelopathy or radiculopathy, cervical region: Secondary | ICD-10-CM | POA: Diagnosis not present

## 2014-07-11 DIAGNOSIS — M47812 Spondylosis without myelopathy or radiculopathy, cervical region: Secondary | ICD-10-CM | POA: Diagnosis not present

## 2014-07-26 DIAGNOSIS — E119 Type 2 diabetes mellitus without complications: Secondary | ICD-10-CM | POA: Diagnosis not present

## 2014-07-26 DIAGNOSIS — H4921 Sixth [abducent] nerve palsy, right eye: Secondary | ICD-10-CM | POA: Diagnosis not present

## 2014-07-26 DIAGNOSIS — H2513 Age-related nuclear cataract, bilateral: Secondary | ICD-10-CM | POA: Diagnosis not present

## 2014-07-26 LAB — HM DIABETES EYE EXAM

## 2014-08-01 DIAGNOSIS — M47812 Spondylosis without myelopathy or radiculopathy, cervical region: Secondary | ICD-10-CM | POA: Diagnosis not present

## 2014-08-04 ENCOUNTER — Encounter: Payer: Self-pay | Admitting: Family Medicine

## 2014-08-04 DIAGNOSIS — H4920 Sixth [abducent] nerve palsy, unspecified eye: Secondary | ICD-10-CM | POA: Insufficient documentation

## 2014-08-05 ENCOUNTER — Encounter: Payer: Self-pay | Admitting: Family Medicine

## 2014-08-15 DIAGNOSIS — M47812 Spondylosis without myelopathy or radiculopathy, cervical region: Secondary | ICD-10-CM | POA: Diagnosis not present

## 2014-08-21 ENCOUNTER — Ambulatory Visit: Payer: Self-pay | Admitting: Family Medicine

## 2014-08-21 ENCOUNTER — Encounter: Payer: Self-pay | Admitting: Family Medicine

## 2014-08-21 ENCOUNTER — Ambulatory Visit (HOSPITAL_COMMUNITY): Payer: Self-pay | Admitting: Psychiatry

## 2014-08-21 ENCOUNTER — Ambulatory Visit (INDEPENDENT_AMBULATORY_CARE_PROVIDER_SITE_OTHER): Payer: Medicare Other | Admitting: Family Medicine

## 2014-08-21 VITALS — BP 158/92 | HR 74 | Temp 98.5°F | Wt 258.0 lb

## 2014-08-21 DIAGNOSIS — I10 Essential (primary) hypertension: Secondary | ICD-10-CM

## 2014-08-21 DIAGNOSIS — E785 Hyperlipidemia, unspecified: Secondary | ICD-10-CM

## 2014-08-21 DIAGNOSIS — IMO0002 Reserved for concepts with insufficient information to code with codable children: Secondary | ICD-10-CM

## 2014-08-21 DIAGNOSIS — H4921 Sixth [abducent] nerve palsy, right eye: Secondary | ICD-10-CM

## 2014-08-21 DIAGNOSIS — E1165 Type 2 diabetes mellitus with hyperglycemia: Secondary | ICD-10-CM | POA: Diagnosis not present

## 2014-08-21 DIAGNOSIS — E119 Type 2 diabetes mellitus without complications: Secondary | ICD-10-CM

## 2014-08-21 MED ORDER — ASPIRIN EC 81 MG PO TBEC: 81.0000 mg | DELAYED_RELEASE_TABLET | Freq: Every day | ORAL | Status: AC

## 2014-08-21 MED ORDER — METOPROLOL SUCCINATE ER 50 MG PO TB24
150.0000 mg | ORAL_TABLET | Freq: Every day | ORAL | Status: DC
Start: 2014-08-21 — End: 2014-11-12

## 2014-08-21 NOTE — Progress Notes (Signed)
Pre visit review using our clinic review tool, if applicable. No additional management support is needed unless otherwise documented below in the visit note.  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 Had typical double vision.  Some better in the last few weeks.  Has had f/u with eye clinic, will see again later this week.  No new sx.  No other focal neuro sx.  D/w pt about secondary prevention.  Not yet on ASA, so not an ASA failure.   DM2.  Last A1c improved, d/w pt.  Due for f/u labs.   Compliant with meds.  No ADE on meds.  D/w pt about diet and weight.   HTN.  Elevated with pain.  D/w pt about goal of getting BP controlled when not in pain.  If we have his BP controlled during pain flares, then he'll likely be hypotensive o/w.  BP has been elevated.  Currently on 100mg  metoprolol per day.  Compliant with other meds.   HLD.  Taking statin once weekly, no ADE.  Due for f/u labs.  D/w pt about lower LDL goal.    PMH and SH reviewed  ROS: See HPI, otherwise noncontributory.  Meds, vitals, and allergies reviewed.   GEN: nad, alert and oriented, obese HEENT: mucous membranes moist, EOMI except for slight dec in ROM for R eye abduction with eye tracking.  PERRL  NECK: supple w/o LA CV: rrr.  PULM: ctab, no inc wob ABD: soft, +bs EXT: no edema SKIN: no acute rash

## 2014-08-21 NOTE — Patient Instructions (Signed)
Fasting labs when possible.  Start 81mg  aspirin a day.  Up the metoprolol to 150mg  a day.  Update me if BP consistently >140/>90. I'll await an update from the eye clinic.  Take care.

## 2014-08-22 ENCOUNTER — Encounter: Payer: Self-pay | Admitting: Family Medicine

## 2014-08-22 DIAGNOSIS — H4920 Sixth [abducent] nerve palsy, unspecified eye: Secondary | ICD-10-CM | POA: Insufficient documentation

## 2014-08-22 NOTE — Assessment & Plan Note (Addendum)
Start ASA 81mg  a day, f/u with eye clinic.  Continue secondary prevention, see below.  Greatly appreciate care from Dr. Katy Fitch.

## 2014-08-22 NOTE — Assessment & Plan Note (Signed)
Recheck A1c, continue work on diet and weight loss.  Last A1c 7.1.  No change in meds yet.

## 2014-08-22 NOTE — Assessment & Plan Note (Signed)
Inc metoprolol to 150mg  a day.  Update me as needed with BP.  He agrees.  Needs work on weight loss, d/w pt. >25 minutes spent in face to face time with patient, >50% spent in counselling or coordination of care.

## 2014-08-22 NOTE — Assessment & Plan Note (Signed)
Recheck labs pending, will likely need to inc frequency of statin dosing.  D/w pt.

## 2014-08-23 DIAGNOSIS — H4921 Sixth [abducent] nerve palsy, right eye: Secondary | ICD-10-CM | POA: Diagnosis not present

## 2014-08-23 DIAGNOSIS — H2513 Age-related nuclear cataract, bilateral: Secondary | ICD-10-CM | POA: Diagnosis not present

## 2014-08-29 DIAGNOSIS — M47812 Spondylosis without myelopathy or radiculopathy, cervical region: Secondary | ICD-10-CM | POA: Diagnosis not present

## 2014-08-30 ENCOUNTER — Other Ambulatory Visit (INDEPENDENT_AMBULATORY_CARE_PROVIDER_SITE_OTHER): Payer: Medicare Other

## 2014-08-30 DIAGNOSIS — E119 Type 2 diabetes mellitus without complications: Secondary | ICD-10-CM

## 2014-08-30 LAB — COMPREHENSIVE METABOLIC PANEL
ALK PHOS: 70 U/L (ref 39–117)
ALT: 21 U/L (ref 0–53)
AST: 15 U/L (ref 0–37)
Albumin: 4.5 g/dL (ref 3.5–5.2)
BUN: 21 mg/dL (ref 6–23)
CO2: 32 mEq/L (ref 19–32)
CREATININE: 1.06 mg/dL (ref 0.40–1.50)
Calcium: 9.1 mg/dL (ref 8.4–10.5)
Chloride: 98 mEq/L (ref 96–112)
GFR: 75.95 mL/min (ref >60.00)
GLUCOSE: 174 mg/dL — AB (ref 70–99)
Potassium: 4.1 mEq/L (ref 3.5–5.1)
SODIUM: 135 meq/L (ref 135–145)
Total Bilirubin: 0.6 mg/dL (ref 0.2–1.2)
Total Protein: 7.6 g/dL (ref 6.0–8.3)

## 2014-08-30 LAB — LIPID PANEL
CHOLESTEROL: 175 mg/dL (ref 0–200)
HDL: 32.4 mg/dL — ABNORMAL LOW (ref >39.00)
LDL Cholesterol: 103 mg/dL — ABNORMAL HIGH (ref 0–99)
NonHDL: 142.6
TRIGLYCERIDES: 200 mg/dL — AB (ref 0.0–149.0)
Total CHOL/HDL Ratio: 5
VLDL: 40 mg/dL (ref 0.0–40.0)

## 2014-08-30 LAB — HEMOGLOBIN A1C: Hgb A1c MFr Bld: 6.9 % — ABNORMAL HIGH (ref 4.6–6.5)

## 2014-09-01 ENCOUNTER — Other Ambulatory Visit: Payer: Self-pay | Admitting: Family Medicine

## 2014-09-01 MED ORDER — ROSUVASTATIN CALCIUM 20 MG PO TABS: 20.0000 mg | ORAL_TABLET | ORAL | Status: DC

## 2014-09-09 ENCOUNTER — Encounter: Payer: Self-pay | Admitting: Family Medicine

## 2014-09-09 ENCOUNTER — Other Ambulatory Visit: Payer: Self-pay | Admitting: *Deleted

## 2014-09-09 MED ORDER — ROSUVASTATIN CALCIUM 20 MG PO TABS: 20.0000 mg | ORAL_TABLET | ORAL | Status: DC

## 2014-09-13 DIAGNOSIS — M47812 Spondylosis without myelopathy or radiculopathy, cervical region: Secondary | ICD-10-CM | POA: Diagnosis not present

## 2014-09-18 ENCOUNTER — Ambulatory Visit (HOSPITAL_COMMUNITY): Payer: Self-pay | Admitting: Psychiatry

## 2014-09-27 DIAGNOSIS — M47812 Spondylosis without myelopathy or radiculopathy, cervical region: Secondary | ICD-10-CM | POA: Diagnosis not present

## 2014-10-02 ENCOUNTER — Ambulatory Visit (HOSPITAL_COMMUNITY): Payer: Self-pay | Admitting: Psychiatry

## 2014-10-07 ENCOUNTER — Encounter: Payer: Self-pay | Admitting: Podiatry

## 2014-10-07 ENCOUNTER — Ambulatory Visit (INDEPENDENT_AMBULATORY_CARE_PROVIDER_SITE_OTHER): Payer: Medicare Other | Admitting: Podiatry

## 2014-10-07 VITALS — BP 162/92 | HR 76 | Resp 12

## 2014-10-07 DIAGNOSIS — L03031 Cellulitis of right toe: Secondary | ICD-10-CM

## 2014-10-07 DIAGNOSIS — L03011 Cellulitis of right finger: Secondary | ICD-10-CM

## 2014-10-07 NOTE — Patient Instructions (Signed)

## 2014-10-07 NOTE — Progress Notes (Signed)
   Subjective:    Patient ID: Vincent [name], male    DOB: 1955-11-24, 59 y.o.   MRN: [id number]  HPI PT STATED RT FOOT GREAT TOENAIL IS SORE, HAVE REDNESS FOR 3 DAYS. THE TOE IS GETTING WORSE ESPECIALLY WHEN PUTTING PRESSURE. TRIED TO SOAK WITH EPSOM SALT AND USED NEOSPORIN.   Review of Systems  Musculoskeletal: Positive for neck pain.  Skin: Positive for color change.       Objective:   Physical Exam        Assessment & Plan:

## 2014-10-08 NOTE — Progress Notes (Signed)
Subjective:     Patient ID: Vincent [name], male   DOB: 1955/07/26, 59 y.o.   MRN: [id number]  HPI patient presents stating I have had some localized drainage my right big toe and I wanted to make sure it was okay   Review of Systems     Objective:   Physical Exam Neurovascular status intact with patient having distal redness hallux right of the localized nature with no proximal edema erythema or drainage noted.    Assessment:     Localized paronychia infection right hallux nail    Plan:     Explained condition to patient and caregiver and infiltrated the right hallux 60 Milligrams Marcaine mixture remove the border created channel for drainage and applied sterile dressing. Gave instructions on soaks and reappoint to recheck

## 2014-10-17 DIAGNOSIS — M47812 Spondylosis without myelopathy or radiculopathy, cervical region: Secondary | ICD-10-CM | POA: Diagnosis not present

## 2014-10-22 DIAGNOSIS — E119 Type 2 diabetes mellitus without complications: Secondary | ICD-10-CM | POA: Diagnosis not present

## 2014-10-22 DIAGNOSIS — H4921 Sixth [abducent] nerve palsy, right eye: Secondary | ICD-10-CM | POA: Diagnosis not present

## 2014-10-22 DIAGNOSIS — H2513 Age-related nuclear cataract, bilateral: Secondary | ICD-10-CM | POA: Diagnosis not present

## 2014-10-31 DIAGNOSIS — M47812 Spondylosis without myelopathy or radiculopathy, cervical region: Secondary | ICD-10-CM | POA: Diagnosis not present

## 2014-11-04 ENCOUNTER — Encounter (HOSPITAL_COMMUNITY): Payer: Self-pay | Admitting: Psychiatry

## 2014-11-04 ENCOUNTER — Ambulatory Visit (INDEPENDENT_AMBULATORY_CARE_PROVIDER_SITE_OTHER): Payer: Medicare Other | Admitting: Psychiatry

## 2014-11-04 VITALS — BP 160/96 | HR 76 | Ht 71.0 in | Wt 256.2 lb

## 2014-11-04 DIAGNOSIS — F419 Anxiety disorder, unspecified: Secondary | ICD-10-CM

## 2014-11-04 DIAGNOSIS — F0630 Mood disorder due to known physiological condition, unspecified: Secondary | ICD-10-CM

## 2014-11-04 MED ORDER — ATIVAN 1 MG PO TABS: 1.0000 mg | ORAL_TABLET | Freq: Every day | ORAL | Status: DC | PRN

## 2014-11-04 MED ORDER — DULOXETINE HCL 60 MG PO CPEP: 60.0000 mg | ORAL_CAPSULE | Freq: Every day | ORAL | Status: DC

## 2014-11-04 NOTE — Progress Notes (Signed)
[facility] Progress Note  Vincent [name] [id number] 59 y.o.  11/04/2014 1:52 PM  Chief Complaint:  I stop taking BuSpar because it was not helping her anxiety.  For past few weeks I've been more anxious and I'm taking Ativan to relieve my anxiety.      History of Present Illness:  Vincent [name] came for his followup appointment.  He missed last appointment because he was visiting [place] with his wife.  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  [place] feeling better.  His diplopia is resolved.  He has been very stressful in recent weeks because he had to put his dog due to health issues.  He was very sad and depressed and recently decided to get another dog however that did not work very well.  Patient told even though decision to bring dog was mutual with his wife but when dog arrived it was very difficult because dog did not like staying with them.  They have to return the dog and his wife blamed everything on him.  He admitted some frustration and arguments with his wife but now things are getting better.  Patient also very disappointed from [place] visit because it was expensive and rather remains issue.  Not couple is deciding to stay in [place] until they find any other place.  He was also disappointed because he did not get any showing for his house.  House is still on the market.  Patient admitted lately he's been having some anxiety and nervousness.  He continues to have chronic pain.  He stopped taking BuSpar because he did not felt any improvement.  He was not taking Ativan until recently he start taking 1-2 times a week.  In the future he wants to stop Ativan.  He is complying the Cymbalta.  He is taking melatonin for sleep.  Recently he seen his primary care physician and he was happy about his blood sugar.  His lipid A1c is also dropped from the past.  Patient denies any irritability, crying spells, feeling of hopelessness or  worthlessness.  He has no tremors or shakes.  His appetite is okay.  His vitals are stable.  Patient wants to continue Cymbalta and Ativan as needed.  Patient denies drinking or using any illegal substances.  Patient lives with his wife who is a Marine scientist and very supportive.  Patient has no children.  Suicidal Ideation: No Plan Formed: No Patient has means to carry out plan: No  Homicidal Ideation: No Plan Formed: No Patient has means to carry out plan: No  Review of Systems  Constitutional: Negative.   Musculoskeletal: Positive for back pain and neck pain.  Skin: Negative for itching and rash.  Psychiatric/Behavioral: Negative for hallucinations and substance abuse. The patient is nervous/anxious.        Anxiety   Psychiatric: Agitation: No Hallucination: No Depressed Mood: No Insomnia: No Hypersomnia: No Altered Concentration: No Feels Worthless: No Grandiose Ideas: No Belief In Special Powers: No New/Increased Substance Abuse: No Compulsions: No  Neurologic: Headache: No Seizure: No Paresthesias: Chronic pain and numbness.  Complain of back pain and joint pain.  Medical History;  Patient has a history of gout which is controlled by diet.  He has diffuse idiopathic skeletal hyperostosis, diabetes mellitus , seasonal allergies, hyperlipidemia, chronic back pain, osteoporosis, hypertension and recently kidney stones.      Outpatient Encounter Prescriptions as of 11/04/2014  Medication Sig  . aspirin EC 81  MG tablet Take 1 tablet (81 mg total) by mouth daily.  . ATIVAN 1 MG tablet Take 1 tablet (1 mg total) by mouth daily as needed for anxiety.  Marland Kitchen azelastine (ASTELIN) 137 MCG/SPRAY nasal spray Place 1 spray into the nose as needed. Use in each nostril as directed  . B-D ULTRA-FINE 33 LANCETS MISC 1 each by Does not apply route 2 (two) times daily at 8 am and 10 pm.  . butalbital-acetaminophen-caffeine (ESGIC) 50-325-40 MG per tablet Take 1 tablet by mouth 2 (two) times daily as  needed for pain or headache.  . Butenafine HCl 1 % cream Apply topically 2 (two) times daily.  . candesartan-hydrochlorothiazide (ATACAND HCT) 32-12.5 MG per tablet Take 1 tablet by mouth daily.  . celecoxib (CELEBREX) 200 MG capsule Take 1 capsule (200 mg total) by mouth 2 (two) times daily.  . cetirizine (ZYRTEC) 10 MG tablet Take 10 mg by mouth daily.  . cyclobenzaprine (FLEXERIL) 10 MG tablet Take 1 tablet (10 mg total) by mouth 3 (three) times daily as needed.  . diclofenac sodium (VOLTAREN) 1 % GEL Apply 1 application topically as needed.  . DULoxetine (CYMBALTA) 60 MG capsule Take 1 capsule (60 mg total) by mouth daily.  Marland Kitchen eletriptan (RELPAX) 40 MG tablet Take 1 tablet (40 mg total) by mouth as needed for migraine. may repeat in 2 hours if necessary  . esomeprazole (NEXIUM) 40 MG capsule Take 1 capsule (40 mg total) by mouth daily before breakfast.  . fluticasone (FLONASE) 50 MCG/ACT nasal spray Place 2 sprays into both nostrils daily. 2 sprays each nostril daily  . glucose blood test strip Use as instructed (Patient taking differently: Bayer Contour. Use as instructed)  . Melatonin 3 MG CAPS Take 3 mg by mouth at bedtime as needed (for sleep).   . metFORMIN (GLUCOPHAGE) 500 MG tablet 2 tabs in AM and 1 tab in PM.  Total 1500mg  a day.  . metoprolol succinate (TOPROL-XL) 50 MG 24 hr tablet Take 3 tablets (150 mg total) by mouth daily. Take with or immediately following a meal.  . morphine (MSIR) 15 MG tablet Take 1 tablet (15 mg total) by mouth every 4 (four) hours as needed.  . rosuvastatin (CRESTOR) 20 MG tablet Take 1 tablet (20 mg total) by mouth 2 (two) times a week.  . tadalafil (CIALIS) 20 MG tablet Take 20 mg by mouth daily as needed.    . Tapentadol HCl 75 MG TABS Take 1 tablet (75 mg total) by mouth 2 (two) times daily.  . [DISCONTINUED] ATIVAN 1 MG tablet Take 1 tablet (1 mg total) by mouth daily as needed for anxiety.  . [DISCONTINUED] busPIRone (BUSPAR) 5 MG tablet Take 1  tablet (5 mg total) by mouth 2 (two) times daily. (Patient not taking: Reported on 11/04/2014)  . [DISCONTINUED] DULoxetine (CYMBALTA) 60 MG capsule Take 1 capsule (60 mg total) by mouth daily.   No facility-administered encounter medications on file as of 11/04/2014.    Recent Results (from the past 2160 hour(s))  Comprehensive metabolic panel     Status: Abnormal   Collection Time: 08/30/14 10:52 AM  Result Value Ref Range   Sodium 135 135 - 145 mEq/L   Potassium 4.1 3.5 - 5.1 mEq/L   Chloride 98 96 - 112 mEq/L   CO2 32 19 - 32 mEq/L   Glucose, Bld 174 (H) 70 - 99 mg/dL   BUN 21 6 - 23 mg/dL   Creatinine, Ser 1.06 0.40 - 1.50 mg/dL  Total Bilirubin 0.6 0.2 - 1.2 mg/dL   Alkaline Phosphatase 70 39 - 117 U/L   AST 15 0 - 37 U/L   ALT 21 0 - 53 U/L   Total Protein 7.6 6.0 - 8.3 g/dL   Albumin 4.5 3.5 - 5.2 g/dL   Calcium 9.1 8.4 - 10.5 mg/dL   GFR 75.95 >60.00 mL/min  Lipid panel     Status: Abnormal   Collection Time: 08/30/14 10:52 AM  Result Value Ref Range   Cholesterol 175 0 - 200 mg/dL    Comment: ATP III Classification       Desirable:  < 200 mg/dL               Borderline High:  200 - 239 mg/dL          High:  > = 240 mg/dL   Triglycerides 200.0 (H) 0.0 - 149.0 mg/dL    Comment: Normal:  <150 mg/dLBorderline High:  150 - 199 mg/dL   HDL 32.40 (L) >39.00 mg/dL   VLDL 40.0 0.0 - 40.0 mg/dL   LDL Cholesterol 103 (H) 0 - 99 mg/dL   Total CHOL/HDL Ratio 5     Comment:                Men          Women1/2 Average Risk     3.4          3.3Average Risk          5.0          4.42X Average Risk          9.6          7.13X Average Risk          15.0          11.0                       NonHDL 142.60     Comment: NOTE:  Non-HDL goal should be 30 mg/dL higher than patient's LDL goal (i.e. LDL goal of < 70 mg/dL, would have non-HDL goal of < 100 mg/dL)  Hemoglobin A1c     Status: Abnormal   Collection Time: 08/30/14 10:52 AM  Result Value Ref Range   Hgb A1c MFr Bld 6.9 (H) 4.6 - 6.5 %     Comment: Glycemic Control Guidelines for People with Diabetes:Non Diabetic:  <6%Goal of Therapy: <7%Additional Action Suggested:  >8%     Past Psychiatric History/Hospitalization(s) Patient denies any previous history of psychiatric inpatient treatment or any suicidal attempt.  He has history of anxiety and depression mostly due to his chronic back pain and neck pain.  In the past he has taken Paxil and Neurontin however he liked Cymbalta better.  Patient denies any history of paranoia, hallucination, mania, psychosis or any agitation. Anxiety: Yes Bipolar Disorder: No Depression: No Mania: No Psychosis: No Schizophrenia: No Personality Disorder: No Hospitalization for psychiatric illness: No History of Electroconvulsive Shock Therapy: No Prior Suicide Attempts: No  Physical Exam: Constitutional:  BP 160/96 mmHg  Pulse 76  Ht 5\' 11"  (1.803 m)  Wt 256 lb 3.2 oz (116.212 kg)  BMI 35.75 kg/m2  Recent Results (from the past 2160 hour(s))  Comprehensive metabolic panel     Status: Abnormal   Collection Time: 08/30/14 10:52 AM  Result Value Ref Range   Sodium 135 135 - 145 mEq/L   Potassium 4.1 3.5 - 5.1 mEq/L   Chloride  98 96 - 112 mEq/L   CO2 32 19 - 32 mEq/L   Glucose, Bld 174 (H) 70 - 99 mg/dL   BUN 21 6 - 23 mg/dL   Creatinine, Ser 1.06 0.40 - 1.50 mg/dL   Total Bilirubin 0.6 0.2 - 1.2 mg/dL   Alkaline Phosphatase 70 39 - 117 U/L   AST 15 0 - 37 U/L   ALT 21 0 - 53 U/L   Total Protein 7.6 6.0 - 8.3 g/dL   Albumin 4.5 3.5 - 5.2 g/dL   Calcium 9.1 8.4 - 10.5 mg/dL   GFR 75.95 >60.00 mL/min  Lipid panel     Status: Abnormal   Collection Time: 08/30/14 10:52 AM  Result Value Ref Range   Cholesterol 175 0 - 200 mg/dL    Comment: ATP III Classification       Desirable:  < 200 mg/dL               Borderline High:  200 - 239 mg/dL          High:  > = 240 mg/dL   Triglycerides 200.0 (H) 0.0 - 149.0 mg/dL    Comment: Normal:  <150 mg/dLBorderline High:  150 - 199 mg/dL   HDL  32.40 (L) >39.00 mg/dL   VLDL 40.0 0.0 - 40.0 mg/dL   LDL Cholesterol 103 (H) 0 - 99 mg/dL   Total CHOL/HDL Ratio 5     Comment:                Men          Women1/2 Average Risk     3.4          3.3Average Risk          5.0          4.42X Average Risk          9.6          7.13X Average Risk          15.0          11.0                       NonHDL 142.60     Comment: NOTE:  Non-HDL goal should be 30 mg/dL higher than patient's LDL goal (i.e. LDL goal of < 70 mg/dL, would have non-HDL goal of < 100 mg/dL)  Hemoglobin A1c     Status: Abnormal   Collection Time: 08/30/14 10:52 AM  Result Value Ref Range   Hgb A1c MFr Bld 6.9 (H) 4.6 - 6.5 %    Comment: Glycemic Control Guidelines for People with Diabetes:Non Diabetic:  <6%Goal of Therapy: <7%Additional Action Suggested:  >8%     Musculoskeletal: Strength & Muscle Tone: spastic Gait & Station: Using a cane to walk because of pain Patient leans: Front  Mental Status Examination;  Patient is casually dressed and fairly groomed. He is anxious but cooperative.  He described his mood anxious and his affect is appropriate.  He maintained good eye contact. His speech is slow with normal tone and volume. His thought processes logical and goal-directed. He denies any active or passive suicidal thoughts or homicidal thoughts. His attention and concentration is fair. He denies any auditory or visual hallucination. His fund of knowledge is adequate. There no psychotic symptoms present. He's alert and oriented x3.  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 Anxiety   . Depression   . Gout   . DISH (diffuse idiopathic skeletal hyperostosis)     prev rheum w/u done at [facility]   . Seasonal allergies   . Hyperlipidemia   . Kidney stone 2009  . Diabetes mellitus without complication   . GERD (gastroesophageal reflux disease)   . Panic   . Diverticulitis   . 6Th nerve palsy     2016, right   Plan:  I review her current medication, recent blood work results and psychosocial stressors.  I will discontinue BuSpar since patient is no longer taking it.  Continue Cymbalta 60 mg daily.  Patient does not have any side effects.  Continue Ativan 1 mg as needed for severe anxiety and nervousness.  Patient does not take every day.  Discussed benzodiazepine dependence and tolerance.  I also recommended to see therapist however patient declined and promised that he will call us if he needed counseling.  Discuss safety concern that anytime having active suicidal thoughts or homicidal thought that he need to call 911 or go to the local emergency room.  I will see him in 3 months. Time spent 25 minutes.  More than 50% of the time spent in psychoeducation, counseling and coordination of care.  Discuss safety plan that anytime having active suicidal thoughts or homicidal thoughts then patient need to call 911 or go to the local emergency room.   Raziyah Vanvleck T., MD 11/04/2014

## 2014-11-10 ENCOUNTER — Encounter: Payer: Self-pay | Admitting: Family Medicine

## 2014-11-12 ENCOUNTER — Other Ambulatory Visit: Payer: Self-pay | Admitting: Family Medicine

## 2014-11-12 MED ORDER — NEBIVOLOL HCL 10 MG PO TABS: 10.0000 mg | ORAL_TABLET | Freq: Every day | ORAL | Status: DC

## 2014-11-14 DIAGNOSIS — M47812 Spondylosis without myelopathy or radiculopathy, cervical region: Secondary | ICD-10-CM | POA: Diagnosis not present

## 2014-11-27 ENCOUNTER — Other Ambulatory Visit: Payer: Self-pay | Admitting: Family Medicine

## 2014-11-27 DIAGNOSIS — E1165 Type 2 diabetes mellitus with hyperglycemia: Secondary | ICD-10-CM

## 2014-11-27 DIAGNOSIS — IMO0002 Reserved for concepts with insufficient information to code with codable children: Secondary | ICD-10-CM

## 2014-11-27 DIAGNOSIS — M109 Gout, unspecified: Secondary | ICD-10-CM

## 2014-11-28 DIAGNOSIS — M47812 Spondylosis without myelopathy or radiculopathy, cervical region: Secondary | ICD-10-CM | POA: Diagnosis not present

## 2014-11-29 ENCOUNTER — Other Ambulatory Visit (INDEPENDENT_AMBULATORY_CARE_PROVIDER_SITE_OTHER): Payer: Medicare Other

## 2014-11-29 DIAGNOSIS — IMO0002 Reserved for concepts with insufficient information to code with codable children: Secondary | ICD-10-CM

## 2014-11-29 DIAGNOSIS — M109 Gout, unspecified: Secondary | ICD-10-CM

## 2014-11-29 DIAGNOSIS — E1165 Type 2 diabetes mellitus with hyperglycemia: Secondary | ICD-10-CM | POA: Diagnosis not present

## 2014-11-29 LAB — LIPID PANEL
Cholesterol: 140 mg/dL (ref 0–200)
HDL: 34.5 mg/dL — AB (ref >39.00)
LDL Cholesterol: 73 mg/dL (ref 0–99)
NONHDL: 105.34
TRIGLYCERIDES: 161 mg/dL — AB (ref 0.0–149.0)
Total CHOL/HDL Ratio: 4
VLDL: 32.2 mg/dL (ref 0.0–40.0)

## 2014-11-29 LAB — COMPREHENSIVE METABOLIC PANEL
ALK PHOS: 67 U/L (ref 39–117)
ALT: 24 U/L (ref 0–53)
AST: 19 U/L (ref 0–37)
Albumin: 4.5 g/dL (ref 3.5–5.2)
BILIRUBIN TOTAL: 0.6 mg/dL (ref 0.2–1.2)
BUN: 23 mg/dL (ref 6–23)
CALCIUM: 9.3 mg/dL (ref 8.4–10.5)
CO2: 33 mEq/L — ABNORMAL HIGH (ref 19–32)
CREATININE: 1.13 mg/dL (ref 0.40–1.50)
Chloride: 100 mEq/L (ref 96–112)
GFR: 70.48 mL/min (ref >60.00)
Glucose, Bld: 155 mg/dL — ABNORMAL HIGH (ref 70–99)
Potassium: 4.1 mEq/L (ref 3.5–5.1)
Sodium: 140 mEq/L (ref 135–145)
TOTAL PROTEIN: 7.7 g/dL (ref 6.0–8.3)

## 2014-11-29 LAB — HEMOGLOBIN A1C: Hgb A1c MFr Bld: 7 % — ABNORMAL HIGH (ref 4.6–6.5)

## 2014-11-29 LAB — URIC ACID: URIC ACID, SERUM: 3.9 mg/dL — AB (ref 4.0–7.8)

## 2014-12-05 ENCOUNTER — Encounter: Payer: Self-pay | Admitting: Family Medicine

## 2014-12-05 ENCOUNTER — Other Ambulatory Visit: Payer: Self-pay | Admitting: Family Medicine

## 2014-12-05 ENCOUNTER — Ambulatory Visit (INDEPENDENT_AMBULATORY_CARE_PROVIDER_SITE_OTHER): Payer: Medicare Other | Admitting: Family Medicine

## 2014-12-05 VITALS — BP 138/78 | HR 78 | Wt 257.0 lb

## 2014-12-05 DIAGNOSIS — Z125 Encounter for screening for malignant neoplasm of prostate: Secondary | ICD-10-CM | POA: Diagnosis not present

## 2014-12-05 DIAGNOSIS — E785 Hyperlipidemia, unspecified: Secondary | ICD-10-CM

## 2014-12-05 DIAGNOSIS — Z119 Encounter for screening for infectious and parasitic diseases, unspecified: Secondary | ICD-10-CM | POA: Diagnosis not present

## 2014-12-05 DIAGNOSIS — I10 Essential (primary) hypertension: Secondary | ICD-10-CM

## 2014-12-05 DIAGNOSIS — R399 Unspecified symptoms and signs involving the genitourinary system: Secondary | ICD-10-CM | POA: Diagnosis not present

## 2014-12-05 DIAGNOSIS — M4810 Ankylosing hyperostosis [Forestier], site unspecified: Secondary | ICD-10-CM

## 2014-12-05 DIAGNOSIS — M109 Gout, unspecified: Secondary | ICD-10-CM

## 2014-12-05 DIAGNOSIS — H4921 Sixth [abducent] nerve palsy, right eye: Secondary | ICD-10-CM

## 2014-12-05 DIAGNOSIS — Z0000 Encounter for general adult medical examination without abnormal findings: Secondary | ICD-10-CM

## 2014-12-05 DIAGNOSIS — Z7189 Other specified counseling: Secondary | ICD-10-CM

## 2014-12-05 DIAGNOSIS — E119 Type 2 diabetes mellitus without complications: Secondary | ICD-10-CM

## 2014-12-05 LAB — PSA, MEDICARE: PSA: 2.76 ng/mL (ref 0.10–4.00)

## 2014-12-05 MED ORDER — TAMSULOSIN HCL 0.4 MG PO CAPS: 0.4000 mg | ORAL_CAPSULE | Freq: Every day | ORAL | Status: DC

## 2014-12-05 NOTE — Patient Instructions (Addendum)
Go to the lab on the way out.  We'll contact you with your lab report. Try cutting the metformin back to 2-2.5 pills in a day to see if the diarrhea gets better (if you need to).   Start flomax and update me as needed.  Take care.  Glad to see you.  Recheck labs before a visit in about 6 months.

## 2014-12-05 NOTE — Progress Notes (Signed)
Pre visit review using our clinic review tool, if applicable. No additional management support is needed unless otherwise documented below in the visit note.  CPE- See plan.  Routine anticipatory guidance given to patient.  See health maintenance. Tetanus 2013 Flu- he'll get at the pharmacy, dw pt PNA 2016 Shingles d/w pt.  Due at 81.   D/w patient [id number] for colon cancer screening, including IFOB vs. colonoscopy.  Risks and benefits of both were discussed and patient voiced understanding.  Pt elects to defer for now.  Encouraged but deferred by patient until 2017.  Living will- wife designated if patient were incapacitated.   HIV testing prev neg ~1996.  Pt opts in for HCV screening.  D/w pt re: routine screening.    Diet and exercise d/w pt.  Walking more with his new dog.  Diet is fair, "I've been working avoiding the sweets."   LUTS.  Dec flow rate, slower stream, nocturia.  Started about 1 month ago.  Not burning with urination.  Is bothersome enough to treat.  He had been on flomax prev for stones, not recently.   D/w pt about PSA, reasonable to check given his LUTS.   6th nerve palsy resolved.  No double vision. No other new neuro complaints.   Diabetes:  Using medications without difficulties: yes except for some loose stools with metformin occ.   Hypoglycemic episodes: no Hyperglycemic episodes:no Feet problems: normal sensation.  Blood Sugars averaging:~160 usually in the AM eye exam within last year: yes  Gout.  Uric acid d/wpt, at goal.  No recent flares.    Recently with some finger pain at the L 3rd middle phalanx.  W/o any IP joint erythema.   Hypertension:    Using medication without problems or lightheadedness: yes generally, occ lightheaded with sudden position changes.   Chest pain with exertion:no Edema:no Short of breath:no  Elevated Cholesterol: Using medications without problems:yes Muscle aches: not from statin Diet  compliance:yes Exercise:yes  DISH.  Still on baseline meds, worse pain with temperature changes or rain.  Chronic aches for patient at baseline o/w.  No sedation, no constipation from meds.  He tries to limit use of meds as much as possible.    PMH and SH reviewed  Meds, vitals, and allergies reviewed.   ROS: See HPI.  Otherwise negative.    GEN: nad, alert and oriented HEENT: mucous membranes moist NECK: supple w/o LA CV: rrr. PULM: ctab, no inc wob ABD: soft, +bs EXT: no edema SKIN: no acute rash DRE deferred as it likely wouldn't change mgmt, d/w pt and he agrees.    Diabetic foot exam: Normal inspection No skin breakdown No calluses  Normal DP pulses Normal sensation to light touch and monofilament Nails normal except for B 1st nails absent.

## 2014-12-06 DIAGNOSIS — Z7189 Other specified counseling: Secondary | ICD-10-CM | POA: Insufficient documentation

## 2014-12-06 DIAGNOSIS — R399 Unspecified symptoms and signs involving the genitourinary system: Secondary | ICD-10-CM | POA: Insufficient documentation

## 2014-12-06 DIAGNOSIS — Z0000 Encounter for general adult medical examination without abnormal findings: Secondary | ICD-10-CM | POA: Insufficient documentation

## 2014-12-06 LAB — HEPATITIS C ANTIBODY: HCV Ab: NEGATIVE

## 2014-12-06 NOTE — Assessment & Plan Note (Signed)
No recent flares, uric acid not elevated, continue as is.

## 2014-12-06 NOTE — Assessment & Plan Note (Signed)
Improved on bystolic, continue other meds.  Still needs weight loss, d/w pt.

## 2014-12-06 NOTE — Assessment & Plan Note (Addendum)
Controlled now.  Continue as is with metformin.  Can taper if needed.  Still needs weight loss, d/w pt. Recheck in about 6 months.  D/w pt.

## 2014-12-06 NOTE — Assessment & Plan Note (Signed)
Continue with baseline meds, no ADE on meds, not sedated.  He has enough effect from meds to keep pain manageable.

## 2014-12-06 NOTE — Assessment & Plan Note (Signed)
Lipid improved with statin doses 2x/week, continue as is.  He agrees.  No ADE on med.

## 2014-12-06 NOTE — Assessment & Plan Note (Signed)
Very likely from BPH, d/w pt. Dec flow rate, slower stream, nocturia. Started about 1 month ago. Not burning with urination. Is bothersome enough to treat. He had been on flomax prev for stones, not recently. D/w pt about PSA, reasonable to check given his LUTS.  Start flomax in the meantime.  He agrees.  Update me as needed.

## 2014-12-06 NOTE — Assessment & Plan Note (Signed)
Routine anticipatory guidance given to patient. See health maintenance.  Tetanus 2013  Flu- he'll get at the pharmacy, dw pt  PNA 2016  Shingles d/w pt. Due at 89.  D/w patient [id number] for colon cancer screening, including IFOB vs. colonoscopy. Risks and benefits of both were discussed and patient voiced understanding. Pt elects to defer for now. Encouraged but deferred by patient until 2017.  Living will- wife designated if patient were incapacitated.  HIV testing prev neg ~1996.  Pt opts in for HCV screening. D/w pt re: routine screening.  Diet and exercise d/w pt. Walking more with his new dog. Diet is fair, "I've been working avoiding the sweets."

## 2014-12-06 NOTE — Assessment & Plan Note (Signed)
Now resolved.  

## 2014-12-10 ENCOUNTER — Telehealth: Payer: Self-pay | Admitting: Family Medicine

## 2014-12-10 NOTE — Telephone Encounter (Signed)
Patient got a VM regarding his labwork.  Called pt to inform him of

## 2014-12-10 NOTE — Telephone Encounter (Signed)
Left a detailed message for the patient that his PSA had not changed significantly in 3 years and Dr. Damita Dunnings wanted him to continue as planned.  Also his Hepatitis C test was negative.

## 2014-12-12 DIAGNOSIS — M47812 Spondylosis without myelopathy or radiculopathy, cervical region: Secondary | ICD-10-CM | POA: Diagnosis not present

## 2014-12-26 DIAGNOSIS — M47812 Spondylosis without myelopathy or radiculopathy, cervical region: Secondary | ICD-10-CM | POA: Diagnosis not present

## 2015-01-09 DIAGNOSIS — M47812 Spondylosis without myelopathy or radiculopathy, cervical region: Secondary | ICD-10-CM | POA: Diagnosis not present

## 2015-01-17 DIAGNOSIS — Z23 Encounter for immunization: Secondary | ICD-10-CM | POA: Diagnosis not present

## 2015-01-23 DIAGNOSIS — M47812 Spondylosis without myelopathy or radiculopathy, cervical region: Secondary | ICD-10-CM | POA: Diagnosis not present

## 2015-02-04 ENCOUNTER — Ambulatory Visit (INDEPENDENT_AMBULATORY_CARE_PROVIDER_SITE_OTHER): Payer: Medicare Other | Admitting: Psychiatry

## 2015-02-04 ENCOUNTER — Encounter (HOSPITAL_COMMUNITY): Payer: Self-pay | Admitting: Psychiatry

## 2015-02-04 VITALS — BP 150/81 | HR 71 | Ht 71.0 in | Wt 263.0 lb

## 2015-02-04 DIAGNOSIS — F0630 Mood disorder due to known physiological condition, unspecified: Secondary | ICD-10-CM

## 2015-02-04 DIAGNOSIS — F419 Anxiety disorder, unspecified: Secondary | ICD-10-CM | POA: Diagnosis not present

## 2015-02-04 MED ORDER — DULOXETINE HCL 60 MG PO CPEP: 60.0000 mg | ORAL_CAPSULE | Freq: Every day | ORAL | Status: DC

## 2015-02-04 NOTE — Progress Notes (Signed)
[place] Progress Note  Vincent [name] [id number] 59 y.o.  02/04/2015 2:22 PM  Chief Complaint:  I'm doing much better.  I got a new puppy and I am feeling better.        History of Present Illness:  Vincent [name] came for his followup appointment.  He is doing better in recent months.  Finally he decided to get a puppy and he is very happy with the companion.  Recently he visited [place] with his wife and had a good time but his wife fell and injured her ankle.  She is on disability for 4 weeks and patient is involved in taking care of her.  Overall he described his mood is much better.  He denies any irritability, anger, depression, sadness.  His energy level is good.  He is still has no luck to sell his house.  He is hoping once he can sold his house so he can move to [place].  He has cut down his Ativan from past and he has not taken recently.  He likes his Cymbalta.  He sleeping good.  He denies any feeling of hopelessness or worthlessness.  He denies any crying spells.  His appetite is okay.  His vitals are stable.  He has no tremors or shakes.  Patient lives with his wife who is a Marine scientist and very supportive.  Patient has no children.  He recently seen his primary care physician Dr. Damita Dunnings.  His cholesterol is okay.  His triglyceride is slightly high.  His hemoglobin A1c is 7.0.  Suicidal Ideation: No Plan Formed: No Patient has means to carry out plan: No  Homicidal Ideation: No Plan Formed: No Patient has means to carry out plan: No  Review of Systems  Constitutional: Negative.   Musculoskeletal: Positive for back pain and neck pain.  Skin: Negative for itching and rash.  Psychiatric/Behavioral: Negative for hallucinations and substance abuse.   Psychiatric: Agitation: No Hallucination: No Depressed Mood: No Insomnia: No Hypersomnia: No Altered Concentration: No Feels Worthless: No Grandiose Ideas: No Belief In Special Powers: No New/Increased Substance  Abuse: No Compulsions: No  Neurologic: Headache: No Seizure: No Paresthesias: Chronic pain and numbness.  Complain of back pain and joint pain.  Medical History;  Patient has a history of gout which is controlled by diet.  He has diffuse idiopathic skeletal hyperostosis, diabetes mellitus , seasonal allergies, hyperlipidemia, chronic back pain, osteoporosis, hypertension and recently kidney stones.      Outpatient Encounter Prescriptions as of 02/04/2015  Medication Sig  . aspirin EC 81 MG tablet Take 1 tablet (81 mg total) by mouth daily.  . ATIVAN 1 MG tablet Take 1 tablet (1 mg total) by mouth daily as needed for anxiety.  Marland Kitchen azelastine (ASTELIN) 137 MCG/SPRAY nasal spray Place 1 spray into the nose as needed. Use in each nostril as directed  . B-D ULTRA-FINE 33 LANCETS MISC 1 each by Does not apply route 2 (two) times daily at 8 am and 10 pm.  . butalbital-acetaminophen-caffeine (ESGIC) 50-325-40 MG per tablet Take 1 tablet by mouth 2 (two) times daily as needed for pain or headache.  . Butenafine HCl 1 % cream Apply topically 2 (two) times daily.  . candesartan-hydrochlorothiazide (ATACAND HCT) 32-12.5 MG per tablet Take 1 tablet by mouth daily.  . celecoxib (CELEBREX) 200 MG capsule Take 1 capsule (200 mg total) by mouth 2 (two) times daily.  . cetirizine (ZYRTEC) 10 MG tablet Take 10 mg by mouth daily.  Marland Kitchen  cyclobenzaprine (FLEXERIL) 10 MG tablet Take 1 tablet (10 mg total) by mouth 3 (three) times daily as needed.  . diclofenac sodium (VOLTAREN) 1 % GEL Apply 1 application topically as needed.  . DULoxetine (CYMBALTA) 60 MG capsule Take 1 capsule (60 mg total) by mouth daily.  Marland Kitchen eletriptan (RELPAX) 40 MG tablet Take 1 tablet (40 mg total) by mouth as needed for migraine. may repeat in 2 hours if necessary  . esomeprazole (NEXIUM) 40 MG capsule Take 1 capsule (40 mg total) by mouth daily before breakfast.  . fluticasone (FLONASE) 50 MCG/ACT nasal spray Place 2 sprays into both nostrils  daily. 2 sprays each nostril daily  . glucose blood test strip Use as instructed (Patient taking differently: Bayer Contour. Use as instructed)  . Melatonin 3 MG CAPS Take 3 mg by mouth at bedtime as needed (for sleep).   . metFORMIN (GLUCOPHAGE) 500 MG tablet 2 tabs in AM and 1 tab in PM.  Total 1500mg  a day.  . morphine (MSIR) 15 MG tablet Take 1 tablet (15 mg total) by mouth every 4 (four) hours as needed.  . nebivolol (BYSTOLIC) 10 MG tablet Take 1 tablet (10 mg total) by mouth daily.  . rosuvastatin (CRESTOR) 20 MG tablet Take 1 tablet (20 mg total) by mouth 2 (two) times a week.  . tadalafil (CIALIS) 20 MG tablet Take 20 mg by mouth daily as needed.    . tamsulosin (FLOMAX) 0.4 MG CAPS capsule Take 1 capsule (0.4 mg total) by mouth daily.  . Tapentadol HCl 75 MG TABS Take 1 tablet (75 mg total) by mouth 2 (two) times daily.  . [DISCONTINUED] DULoxetine (CYMBALTA) 60 MG capsule Take 1 capsule (60 mg total) by mouth daily.   No facility-administered encounter medications on file as of 02/04/2015.    Recent Results (from the past 2160 hour(s))  Comprehensive metabolic panel     Status: Abnormal   Collection Time: 11/29/14 10:21 AM  Result Value Ref Range   Sodium 140 135 - 145 mEq/L   Potassium 4.1 3.5 - 5.1 mEq/L   Chloride 100 96 - 112 mEq/L   CO2 33 (H) 19 - 32 mEq/L   Glucose, Bld 155 (H) 70 - 99 mg/dL   BUN 23 6 - 23 mg/dL   Creatinine, Ser 1.13 0.40 - 1.50 mg/dL   Total Bilirubin 0.6 0.2 - 1.2 mg/dL   Alkaline Phosphatase 67 39 - 117 U/L   AST 19 0 - 37 U/L   ALT 24 0 - 53 U/L   Total Protein 7.7 6.0 - 8.3 g/dL   Albumin 4.5 3.5 - 5.2 g/dL   Calcium 9.3 8.4 - 10.5 mg/dL   GFR 70.48 >60.00 mL/min  Lipid panel     Status: Abnormal   Collection Time: 11/29/14 10:21 AM  Result Value Ref Range   Cholesterol 140 0 - 200 mg/dL    Comment: ATP III Classification       Desirable:  < 200 mg/dL               Borderline High:  200 - 239 mg/dL          High:  > = 240 mg/dL    Triglycerides 161.0 (H) 0.0 - 149.0 mg/dL    Comment: Normal:  <150 mg/dLBorderline High:  150 - 199 mg/dL   HDL 34.50 (L) >39.00 mg/dL   VLDL 32.2 0.0 - 40.0 mg/dL   LDL Cholesterol 73 0 - 99 mg/dL   Total CHOL/HDL  Ratio 4     Comment:                Men          Women1/2 Average Risk     3.4          3.3Average Risk          5.0          4.42X Average Risk          9.6          7.13X Average Risk          15.0          11.0                       NonHDL 105.34     Comment: NOTE:  Non-HDL goal should be 30 mg/dL higher than patient's LDL goal (i.e. LDL goal of < 70 mg/dL, would have non-HDL goal of < 100 mg/dL)  Hemoglobin A1c     Status: Abnormal   Collection Time: 11/29/14 10:21 AM  Result Value Ref Range   Hgb A1c MFr Bld 7.0 (H) 4.6 - 6.5 %    Comment: Glycemic Control Guidelines for People with Diabetes:Non Diabetic:  <6%Goal of Therapy: <7%Additional Action Suggested:  >8%   Uric acid     Status: Abnormal   Collection Time: 11/29/14 10:21 AM  Result Value Ref Range   Uric Acid, Serum 3.9 (L) 4.0 - 7.8 mg/dL  PSA, Medicare     Status: None   Collection Time: 12/05/14  3:16 PM  Result Value Ref Range   PSA 2.76 0.10 - 4.00 ng/ml  Hepatitis C antibody     Status: None   Collection Time: 12/05/14  3:16 PM  Result Value Ref Range   HCV Ab NEGATIVE NEGATIVE    Past Psychiatric History/Hospitalization(s) Patient denies any previous history of psychiatric inpatient treatment or any suicidal attempt.  He has history of anxiety and depression mostly due to his chronic back pain and neck pain.  In the past he has taken Paxil, BuSpar and Neurontin however he liked Cymbalta better.  Patient denies any history of paranoia, hallucination, mania, psychosis or any agitation. Anxiety: Yes Bipolar Disorder: No Depression: No Mania: No Psychosis: No Schizophrenia: No Personality Disorder: No Hospitalization for psychiatric illness: No History of Electroconvulsive Shock Therapy: No Prior  Suicide Attempts: No  Physical Exam: Constitutional:  BP 150/81 mmHg  Pulse 71  Ht 5\' 11"  (1.803 m)  Wt 263 lb (119.296 kg)  BMI 36.70 kg/m2  Recent Results (from the past 2160 hour(s))  Comprehensive metabolic panel     Status: Abnormal   Collection Time: 11/29/14 10:21 AM  Result Value Ref Range   Sodium 140 135 - 145 mEq/L   Potassium 4.1 3.5 - 5.1 mEq/L   Chloride 100 96 - 112 mEq/L   CO2 33 (H) 19 - 32 mEq/L   Glucose, Bld 155 (H) 70 - 99 mg/dL   BUN 23 6 - 23 mg/dL   Creatinine, Ser 1.13 0.40 - 1.50 mg/dL   Total Bilirubin 0.6 0.2 - 1.2 mg/dL   Alkaline Phosphatase 67 39 - 117 U/L   AST 19 0 - 37 U/L   ALT 24 0 - 53 U/L   Total Protein 7.7 6.0 - 8.3 g/dL   Albumin 4.5 3.5 - 5.2 g/dL   Calcium 9.3 8.4 - 10.5 mg/dL   GFR 70.48 >60.00 mL/min  Lipid  panel     Status: Abnormal   Collection Time: 11/29/14 10:21 AM  Result Value Ref Range   Cholesterol 140 0 - 200 mg/dL    Comment: ATP III Classification       Desirable:  < 200 mg/dL               Borderline High:  200 - 239 mg/dL          High:  > = 240 mg/dL   Triglycerides 161.0 (H) 0.0 - 149.0 mg/dL    Comment: Normal:  <150 mg/dLBorderline High:  150 - 199 mg/dL   HDL 34.50 (L) >39.00 mg/dL   VLDL 32.2 0.0 - 40.0 mg/dL   LDL Cholesterol 73 0 - 99 mg/dL   Total CHOL/HDL Ratio 4     Comment:                Men          Women1/2 Average Risk     3.4          3.3Average Risk          5.0          4.42X Average Risk          9.6          7.13X Average Risk          15.0          11.0                       NonHDL 105.34     Comment: NOTE:  Non-HDL goal should be 30 mg/dL higher than patient's LDL goal (i.e. LDL goal of < 70 mg/dL, would have non-HDL goal of < 100 mg/dL)  Hemoglobin A1c     Status: Abnormal   Collection Time: 11/29/14 10:21 AM  Result Value Ref Range   Hgb A1c MFr Bld 7.0 (H) 4.6 - 6.5 %    Comment: Glycemic Control Guidelines for People with Diabetes:Non Diabetic:  <6%Goal of Therapy: <7%Additional Action  Suggested:  >8%   Uric acid     Status: Abnormal   Collection Time: 11/29/14 10:21 AM  Result Value Ref Range   Uric Acid, Serum 3.9 (L) 4.0 - 7.8 mg/dL  PSA, Medicare     Status: None   Collection Time: 12/05/14  3:16 PM  Result Value Ref Range   PSA 2.76 0.10 - 4.00 ng/ml  Hepatitis C antibody     Status: None   Collection Time: 12/05/14  3:16 PM  Result Value Ref Range   HCV Ab NEGATIVE NEGATIVE    Musculoskeletal: Strength & Muscle Tone: spastic Gait & Station: Using a cane to walk because of pain Patient leans: Front  Mental Status Examination;  Patient is casually dressed and fairly groomed.  He is pleasant and cooperative.  He maintained good eye contact.  He described his mood euthymic and his affect is appropriate.  His speech is slow with normal tone and volume. His thought processes logical and goal-directed. He denies any active or passive suicidal thoughts or homicidal thoughts. His attention and concentration is fair. He denies any auditory or visual hallucination. His fund of knowledge is adequate. There no psychotic symptoms present. He's alert and oriented x3.  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 Anxiety   . Depression   . Gout   . DISH (diffuse idiopathic skeletal hyperostosis)     prev rheum w/u done at [facility]   . Seasonal allergies   . Hyperlipidemia   . Kidney stone 2009  . Diabetes mellitus without complication ([place])   . GERD (gastroesophageal reflux disease)   . Panic   . Diverticulitis   . 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  He has no side effects.  Review blood work results  and his current medication.  He has no side effects.  I will continue Cymbalta 60 mg daily.  He has not taken Ativan in recent months and he does not need a new prescription at this time.  Discussed medication side effects and benefits.  Recommended to call us back if he has any question or any concern.  Follow-up in 6 month.  Marilouise Densmore T., MD 02/04/2015

## 2015-02-06 DIAGNOSIS — M47812 Spondylosis without myelopathy or radiculopathy, cervical region: Secondary | ICD-10-CM | POA: Diagnosis not present

## 2015-02-25 ENCOUNTER — Other Ambulatory Visit: Payer: Self-pay | Admitting: Family Medicine

## 2015-02-25 DIAGNOSIS — M545 Low back pain, unspecified: Secondary | ICD-10-CM

## 2015-02-25 MED ORDER — MORPHINE SULFATE 15 MG PO TABS: 15.0000 mg | ORAL_TABLET | ORAL | Status: AC | PRN

## 2015-02-25 NOTE — Progress Notes (Signed)
Patient needed refill on morphine.  rx done and given to patient.  Old paper rxs (x2) not used by patient shredded at OV.

## 2015-02-26 ENCOUNTER — Other Ambulatory Visit: Payer: Self-pay | Admitting: *Deleted

## 2015-02-26 MED ORDER — TAMSULOSIN HCL 0.4 MG PO CAPS: 0.4000 mg | ORAL_CAPSULE | Freq: Every day | ORAL | Status: DC

## 2015-02-26 NOTE — Telephone Encounter (Signed)
Received faxed refill request from pharmacy. Sent to pharmacy electronically.

## 2015-02-27 DIAGNOSIS — M47812 Spondylosis without myelopathy or radiculopathy, cervical region: Secondary | ICD-10-CM | POA: Diagnosis not present

## 2015-03-13 DIAGNOSIS — M47812 Spondylosis without myelopathy or radiculopathy, cervical region: Secondary | ICD-10-CM | POA: Diagnosis not present

## 2015-04-03 DIAGNOSIS — M47812 Spondylosis without myelopathy or radiculopathy, cervical region: Secondary | ICD-10-CM | POA: Diagnosis not present

## 2015-04-17 DIAGNOSIS — M47812 Spondylosis without myelopathy or radiculopathy, cervical region: Secondary | ICD-10-CM | POA: Diagnosis not present

## 2015-04-19 IMAGING — CT CT ABD-PELV W/O CM
1 series · 15 of 29 positions shown, 19 images · non-contrast
Comparison: 04/18/2006

CLINICAL DATA: Right-sided abdominal and pelvic pain. Nausea.
Urolithiasis.

EXAM:
CT ABDOMEN AND PELVIS WITHOUT CONTRAST
TECHNIQUE: Multidetector CT imaging of the abdomen and pelvis was performed
following the standard protocol without intravenous contrast.

[Series 4: lung · axial · 0.90mm/px · z∈[-181,-56]mm · 15 of 29 slices shown, 19 images]
[im 3/29  soft-tissue]
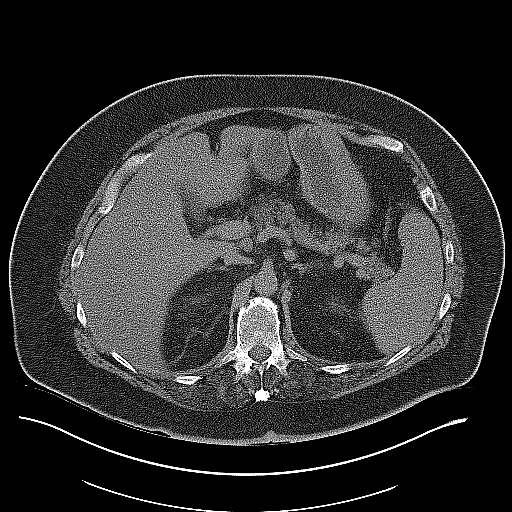
[im 3/29  bone]
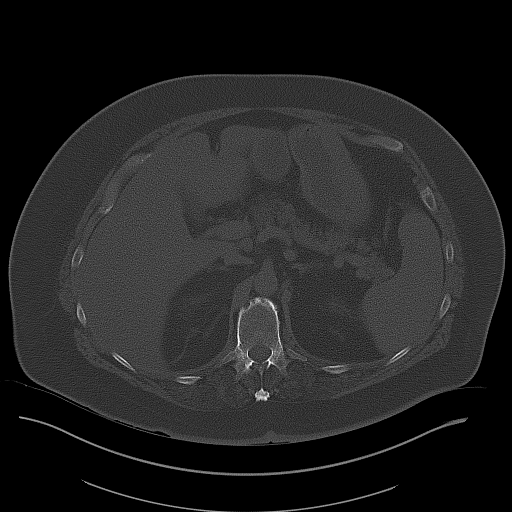
[im 5/29  soft-tissue]
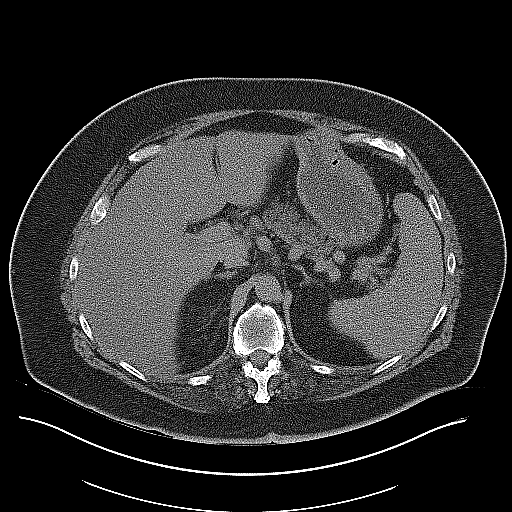
[im 7/29  soft-tissue]
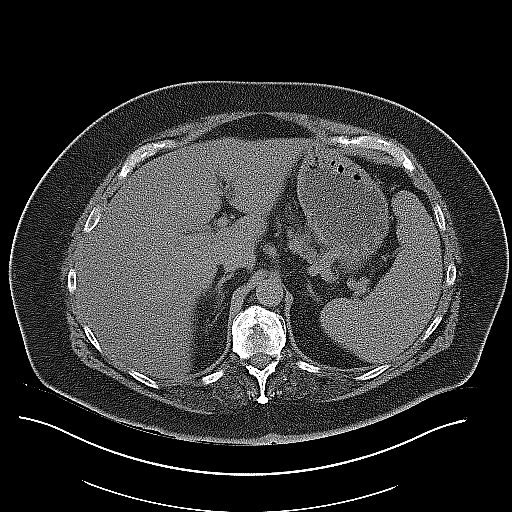
[im 9/29  soft-tissue]
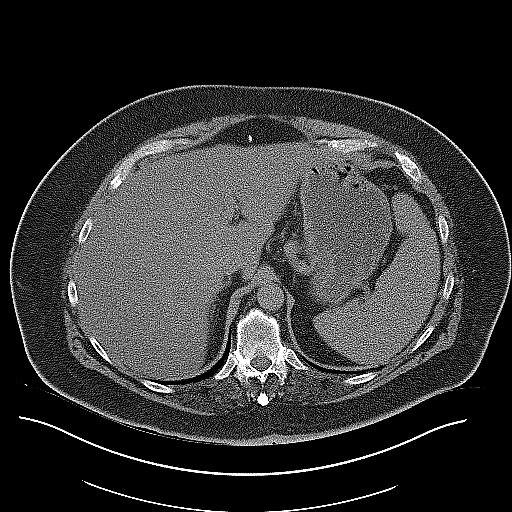
[im 11/29  soft-tissue]
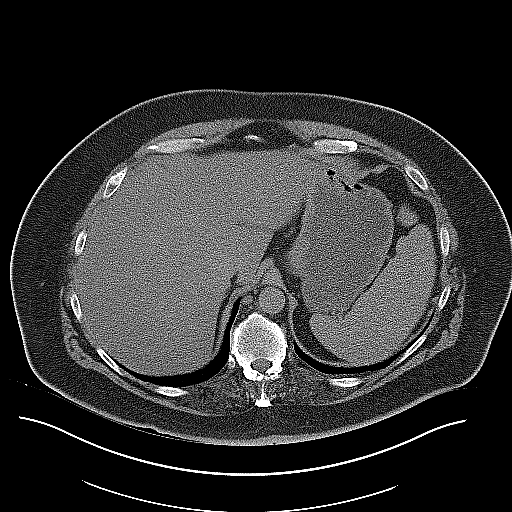
[im 13/29  soft-tissue]
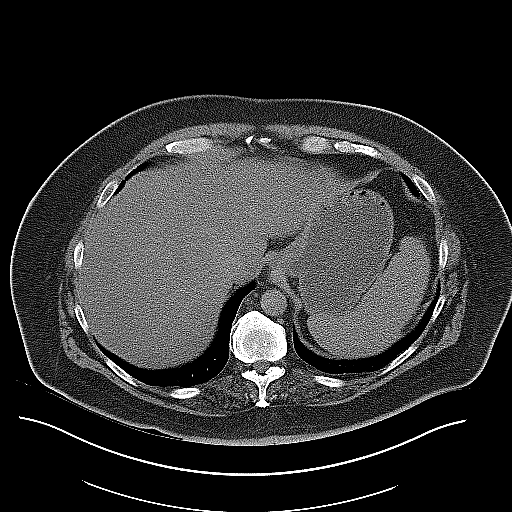
[im 15/29  soft-tissue]
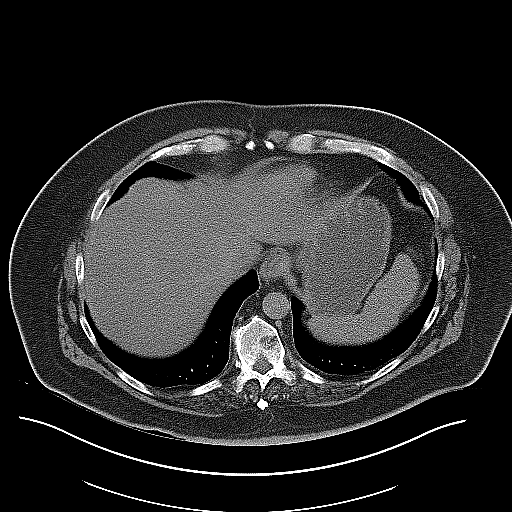
[im 17/29  soft-tissue]
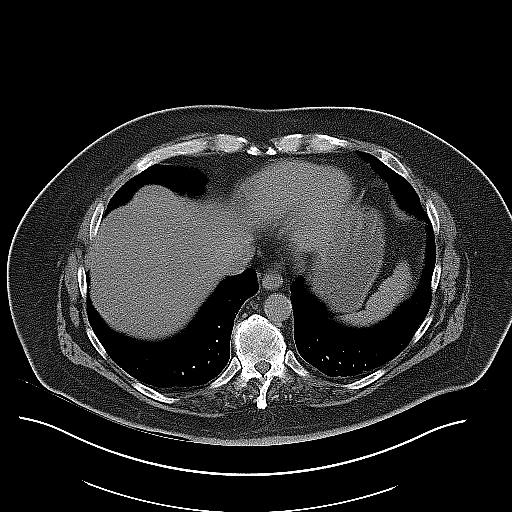
[im 19/29  soft-tissue]
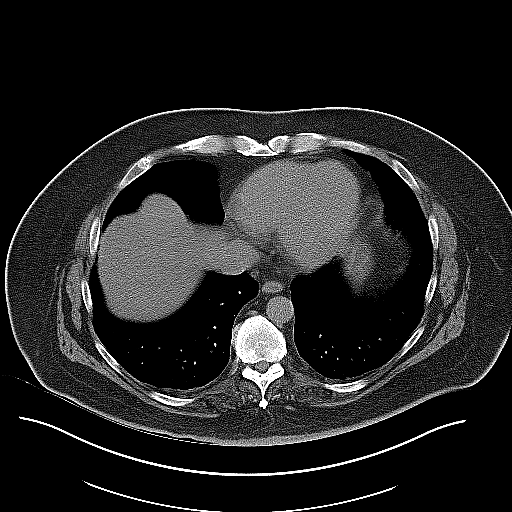
[im 19/29  bone]
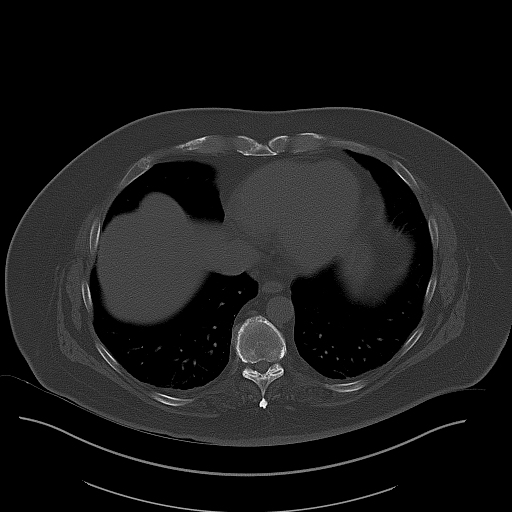
[im 21/29  soft-tissue]
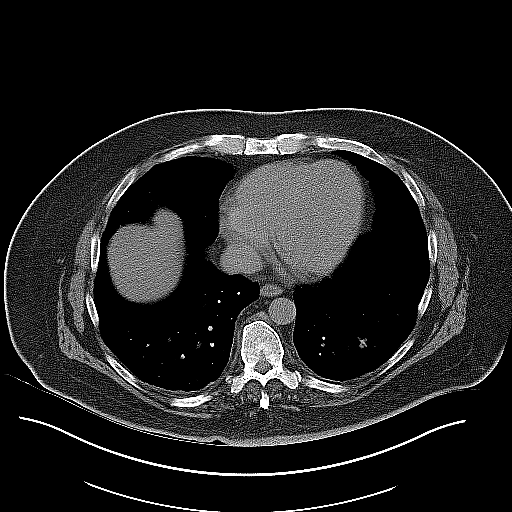
[im 23/29  soft-tissue]
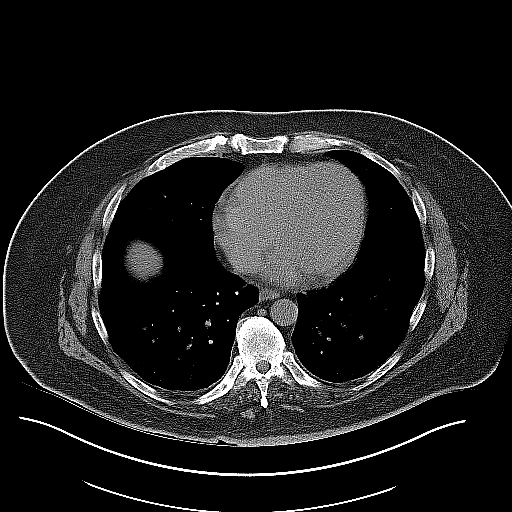
[im 25/29  soft-tissue]
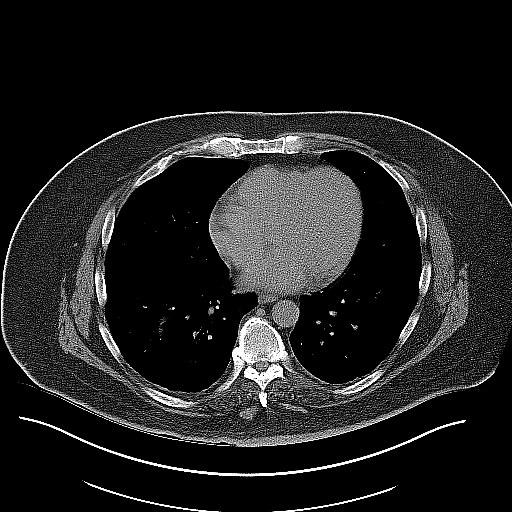
[im 25/29  lung]
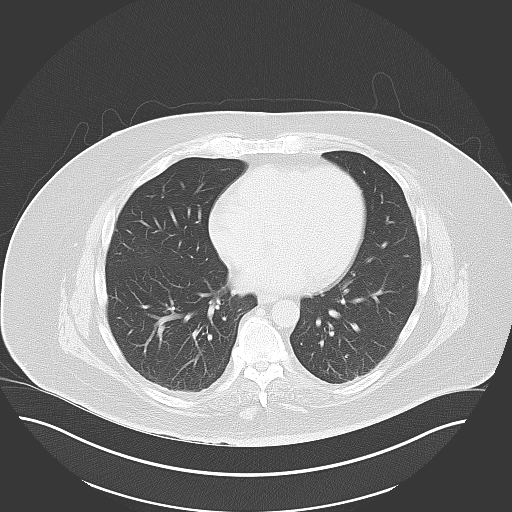
[im 26/29  lung]
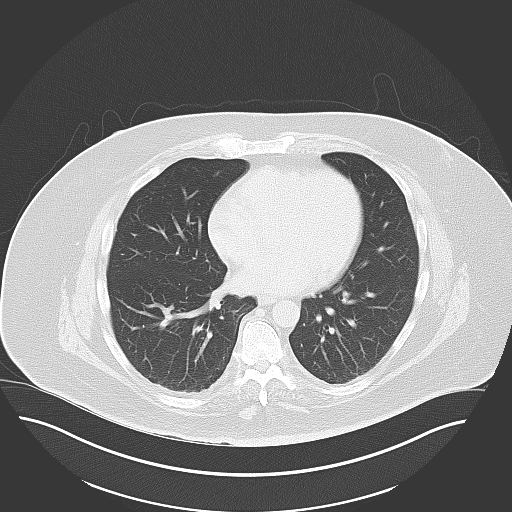
[im 27/29  soft-tissue]
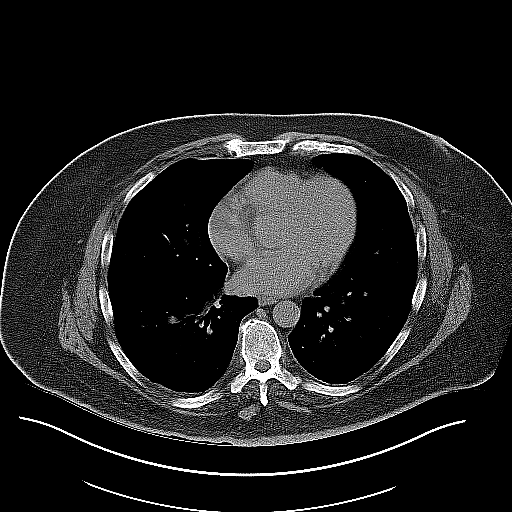
[im 27/29  lung]
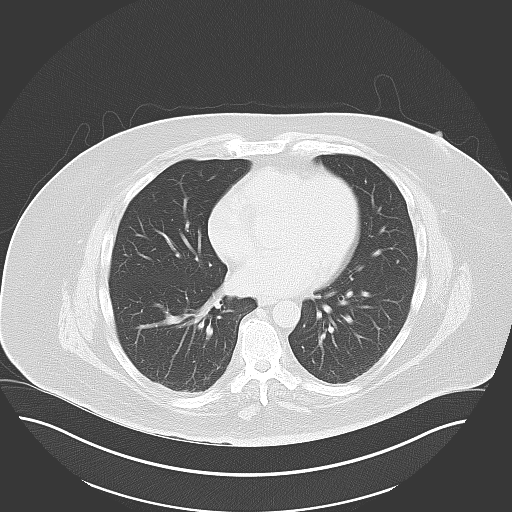
[im 28/29  lung]
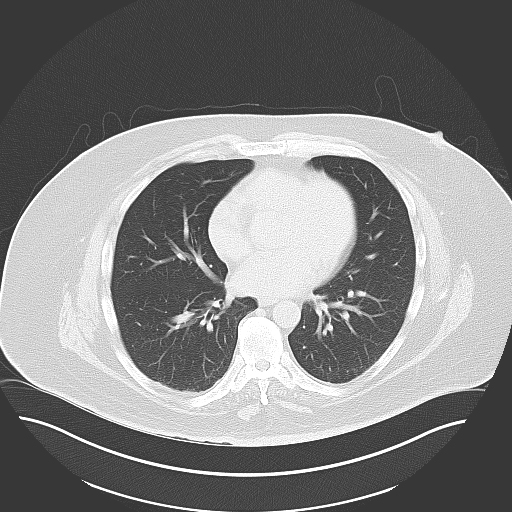

[15 of 29 positions shown; findings below may reference images not displayed]

FINDINGS: Mild right hydronephrosis is seen as well as perinephric stranding.
Mild diffuse right ureteral dilatation seen. A 2 mm calculus is seen
in the urinary bladder near the ureterovesical junction, which may
have already passed into the bladder. No evidence of left-sided
urinary calculi or left-sided hydronephrosis.

The other abdominal parenchymal organs are unremarkable appearance
on this noncontrast study except for mild hepatic steatosis.
Gallbladder is unremarkable. No soft tissue masses or
lymphadenopathy identified. No other inflammatory process or
abnormal fluid collections identified. Mild left colonic
diverticulosis noted, without evidence of diverticulitis.
IMPRESSION: Mild right hydroureteronephrosis, with 2 mm calculus near the right
ureterovesical junction, which [DATE] passed into the bladder.

Mild hepatic steatosis.

Diverticulosis. No radiographic evidence of diverticulitis.

## 2015-04-21 ENCOUNTER — Other Ambulatory Visit: Payer: Self-pay | Admitting: Family Medicine

## 2015-05-01 DIAGNOSIS — M47812 Spondylosis without myelopathy or radiculopathy, cervical region: Secondary | ICD-10-CM | POA: Diagnosis not present

## 2015-05-22 DIAGNOSIS — M47812 Spondylosis without myelopathy or radiculopathy, cervical region: Secondary | ICD-10-CM | POA: Diagnosis not present

## 2015-05-27 ENCOUNTER — Other Ambulatory Visit: Payer: Self-pay | Admitting: Family Medicine

## 2015-05-27 DIAGNOSIS — E119 Type 2 diabetes mellitus without complications: Secondary | ICD-10-CM

## 2015-06-03 ENCOUNTER — Other Ambulatory Visit (INDEPENDENT_AMBULATORY_CARE_PROVIDER_SITE_OTHER): Payer: Medicare Other

## 2015-06-03 DIAGNOSIS — E119 Type 2 diabetes mellitus without complications: Secondary | ICD-10-CM

## 2015-06-03 LAB — HEMOGLOBIN A1C: HEMOGLOBIN A1C: 6.9 % — AB (ref 4.6–6.5)

## 2015-06-05 DIAGNOSIS — M47812 Spondylosis without myelopathy or radiculopathy, cervical region: Secondary | ICD-10-CM | POA: Diagnosis not present

## 2015-06-10 ENCOUNTER — Ambulatory Visit (INDEPENDENT_AMBULATORY_CARE_PROVIDER_SITE_OTHER): Payer: Medicare Other | Admitting: Family Medicine

## 2015-06-10 ENCOUNTER — Encounter: Payer: Self-pay | Admitting: Family Medicine

## 2015-06-10 VITALS — BP 162/92 | HR 85 | Temp 97.3°F | Wt 257.5 lb

## 2015-06-10 DIAGNOSIS — I10 Essential (primary) hypertension: Secondary | ICD-10-CM

## 2015-06-10 DIAGNOSIS — M542 Cervicalgia: Secondary | ICD-10-CM | POA: Diagnosis not present

## 2015-06-10 DIAGNOSIS — E119 Type 2 diabetes mellitus without complications: Secondary | ICD-10-CM | POA: Diagnosis not present

## 2015-06-10 MED ORDER — ELETRIPTAN HYDROBROMIDE 40 MG PO TABS: 40.0000 mg | ORAL_TABLET | ORAL | Status: DC | PRN

## 2015-06-10 MED ORDER — TAPENTADOL HCL 75 MG PO TABS: 75.0000 mg | ORAL_TABLET | Freq: Two times a day (BID) | ORAL | Status: DC

## 2015-06-10 MED ORDER — CANDESARTAN CILEXETIL-HCTZ 32-25 MG PO TABS: 1.0000 | ORAL_TABLET | Freq: Every day | ORAL | Status: DC

## 2015-06-10 MED ORDER — FLUTICASONE PROPIONATE 50 MCG/ACT NA SUSP: 2.0000 | Freq: Every day | NASAL | Status: DC

## 2015-06-10 NOTE — Patient Instructions (Signed)
Dose change on ARB/HCTZ.  Recheck potassium about 10 days after the dose change.  Take care.  Glad to see you.  Physical in about 6 months, labs ahead of time.

## 2015-06-10 NOTE — Progress Notes (Signed)
Pre visit review using our clinic review tool, if applicable. No additional management support is needed unless otherwise documented below in the visit note.  They are building a house in [place] and will likely move later this year.  The drier climate will likely help him.  They'll have to sell their house here. D/w pt.    More pain recently with weather changes here; BP has also been up.  D/w pt.  Taking pain meds at baseline but needs refill on nucynta.  Hasn't used it all but it will out of date soon.  He is trying to limit use, as pain allows.  No ADE on med.    Diabetes:  Using medications without difficulties: no Hypoglycemic episodes:no Hyperglycemic episodes:no Feet problems: occ numbness in the AM.   Blood Sugars averaging: usually ~150 in AM eye exam within last year: yes, f/u this summer pending.    PMH and SH reviewed  Meds, vitals, and allergies reviewed.   ROS: See HPI.  Otherwise negative.    GEN: nad, alert and oriented, chronic dec in neck ROM, at baseline, from pain HEENT: mucous membranes moist NECK: no LA CV: rrr. PULM: ctab, no inc wob ABD: soft, +bs EXT: no edema SKIN: no acute rash

## 2015-06-11 ENCOUNTER — Telehealth: Payer: Self-pay | Admitting: Family Medicine

## 2015-06-11 NOTE — Assessment & Plan Note (Signed)
Controlled, d/w pt.  No change in DM2 meds.  Continue work on [employer] with diet and exercise as tolerated.  Recheck later in 2017.

## 2015-06-11 NOTE — Assessment & Plan Note (Signed)
rx sent.  Continue as is, with prn dosing.

## 2015-06-11 NOTE — Telephone Encounter (Signed)
LM for pt to schedule AWV with Lesia at 1pm on 9/14 before CPE with Dr. Damita Dunnings at 2:15, [place]n

## 2015-06-11 NOTE — Assessment & Plan Note (Signed)
Inc hctz 12.5--->25mg  a day and recheck bmet after med change.  He agrees.   BP inc likely partly due to pain.

## 2015-06-12 NOTE — Telephone Encounter (Signed)
Patient called and scheduled appointment with Katha Cabal at 1:00.

## 2015-06-18 ENCOUNTER — Other Ambulatory Visit: Payer: Self-pay | Admitting: *Deleted

## 2015-06-18 MED ORDER — TAPENTADOL HCL 75 MG PO TABS: 75.0000 mg | ORAL_TABLET | Freq: Two times a day (BID) | ORAL | Status: AC

## 2015-06-18 NOTE — Telephone Encounter (Signed)
Printed.  Thanks.  

## 2015-06-18 NOTE — Telephone Encounter (Signed)
Patient's wife notified by telephone that script is up front ready for pickup.

## 2015-06-18 NOTE — Telephone Encounter (Signed)
Patient's wife requested a written script for Tapentadol 75 mg with a 3 month supply. Patient stated that they received information from [facility] that this script can not be faxed and they need the hard copy to be mailed to them. Call when ready for pickup.

## 2015-06-19 DIAGNOSIS — M47812 Spondylosis without myelopathy or radiculopathy, cervical region: Secondary | ICD-10-CM | POA: Diagnosis not present

## 2015-06-30 ENCOUNTER — Other Ambulatory Visit (INDEPENDENT_AMBULATORY_CARE_PROVIDER_SITE_OTHER): Payer: Medicare Other

## 2015-06-30 ENCOUNTER — Telehealth: Payer: Self-pay | Admitting: Family Medicine

## 2015-06-30 DIAGNOSIS — I10 Essential (primary) hypertension: Secondary | ICD-10-CM | POA: Diagnosis not present

## 2015-06-30 LAB — BASIC METABOLIC PANEL
BUN: 26 mg/dL — ABNORMAL HIGH (ref 6–23)
CALCIUM: 9.3 mg/dL (ref 8.4–10.5)
CO2: 30 meq/L (ref 19–32)
CREATININE: 1.11 mg/dL (ref 0.40–1.50)
Chloride: 99 mEq/L (ref 96–112)
GFR: 71.81 mL/min (ref >60.00)
Glucose, Bld: 177 mg/dL — ABNORMAL HIGH (ref 70–99)
Potassium: 4 mEq/L (ref 3.5–5.1)
SODIUM: 138 meq/L (ref 135–145)

## 2015-06-30 NOTE — Telephone Encounter (Signed)
Done. Thanks.

## 2015-06-30 NOTE — Telephone Encounter (Signed)
Form mailed to patient. Left detailed message on home answering machine (DPR) that form has been mailed to him.

## 2015-06-30 NOTE — Telephone Encounter (Signed)
Patient drop off a disability parking placard form to be signed. Patient requested that it be mailed to him. Form is on your desk.

## 2015-07-17 DIAGNOSIS — M47812 Spondylosis without myelopathy or radiculopathy, cervical region: Secondary | ICD-10-CM | POA: Diagnosis not present

## 2015-07-30 DIAGNOSIS — M47812 Spondylosis without myelopathy or radiculopathy, cervical region: Secondary | ICD-10-CM | POA: Diagnosis not present

## 2015-08-05 ENCOUNTER — Encounter (HOSPITAL_COMMUNITY): Payer: Self-pay | Admitting: Psychiatry

## 2015-08-05 ENCOUNTER — Ambulatory Visit (INDEPENDENT_AMBULATORY_CARE_PROVIDER_SITE_OTHER): Payer: Medicare Other | Admitting: Psychiatry

## 2015-08-05 VITALS — BP 142/88 | HR 80 | Ht 71.0 in | Wt 256.6 lb

## 2015-08-05 DIAGNOSIS — F0630 Mood disorder due to known physiological condition, unspecified: Secondary | ICD-10-CM | POA: Diagnosis not present

## 2015-08-05 DIAGNOSIS — F419 Anxiety disorder, unspecified: Secondary | ICD-10-CM

## 2015-08-05 MED ORDER — ATIVAN 1 MG PO TABS: ORAL_TABLET | ORAL | Status: AC

## 2015-08-05 MED ORDER — DULOXETINE HCL 60 MG PO CPEP: 60.0000 mg | ORAL_CAPSULE | Freq: Every day | ORAL | Status: AC

## 2015-08-05 NOTE — Progress Notes (Signed)
[facility] [phone] Progress Note  EMER KAN GJ:[id number] 60 y.o.  08/05/2015 1:53 PM  Chief Complaint:  I am going to [place]. We are moving on June 1 .  We have closing on May 30 and be her leaving on June 1.            History of Present Illness:  Vincent [name] came for his followup appointment.  He is excited as finally he accepted offer for his house and closing is on May 30.  He also signed the contract to build a new house and [place] .  Patient told he is hoping to move out on June 1 and currently planning for his move.  He admitted lately been more stressful and taking lorazepam because he is having some issues with the contract but now he is relieved that everything work out well.  He has a plan that he will rent the house until house built in November.  Patient has a mixed feeling but he is hoping that overall it would be a good decision for his health.  He takes multiple pain medication and like to cut down in the future and tropical weather may help him.  Overall his sleep is good.  Denies any irritability, anger, mood swing.  He denies any paranoia or any hallucination.  He is taking Cymbalta which is helping his depression and anxiety.  He denies any feeling of hopelessness or worthlessness.  He has not scheduled to see any physician and [place] but like to start the process as soon as possible.  His appetite is okay.  His vital signs are stable.  He had mild panic attacks due to the stress related to move but now he believes everything is organized.  He will require refills until he see psychiatrist or physician at his new location in [place].  Recently he had blood work and his creatinine and BUN is stable.  His bilirubin A1c is 6.9.  Suicidal Ideation: No Plan Formed: No Patient has means to carry out plan: No  Homicidal Ideation: No Plan Formed: No Patient has means to carry out plan: No  Review of Systems  Constitutional: Negative.   Musculoskeletal: Positive for  back pain and neck pain.  Skin: Negative for itching and rash.  Psychiatric/Behavioral: Negative for hallucinations and substance abuse.   Psychiatric: Agitation: No Hallucination: No Depressed Mood: No Insomnia: No Hypersomnia: No Altered Concentration: No Feels Worthless: No Grandiose Ideas: No Belief In Special Powers: No New/Increased Substance Abuse: No Compulsions: No  Neurologic: Headache: No Seizure: No Paresthesias: Chronic pain and numbness.  Complain of back pain and joint pain.  Medical History;  Patient has a history of gout which is controlled by diet.  He has diffuse idiopathic skeletal hyperostosis, diabetes mellitus , seasonal allergies, hyperlipidemia, chronic back pain, osteoporosis, hypertension and recently kidney stones.      Outpatient Encounter Prescriptions as of 08/05/2015  Medication Sig  . aspirin EC 81 MG tablet Take 1 tablet (81 mg total) by mouth daily.  . ATIVAN 1 MG tablet Take 1-2 tab as needed for anxiety  . azelastine (ASTELIN) 137 MCG/SPRAY nasal spray Place 1 spray into the nose as needed. Use in each nostril as directed  . B-D ULTRA-FINE 33 LANCETS MISC 1 each by Does not apply route 2 (two) times daily at 8 am and 10 pm.  . butalbital-acetaminophen-caffeine (ESGIC) 50-325-40 MG per tablet Take 1 tablet by mouth 2 (two) times daily as needed for pain or headache.  Marland Kitchen  Butenafine HCl 1 % cream Apply topically 2 (two) times daily.  . Candesartan Cilexetil-HCTZ 32-25 MG TABS Take 1 tablet by mouth daily.  . celecoxib (CELEBREX) 200 MG capsule TAKE 1 BY MOUTH TWICE DAILY  . cetirizine (ZYRTEC) 10 MG tablet Take 10 mg by mouth daily.  . cyclobenzaprine (FLEXERIL) 10 MG tablet Take 1 tablet (10 mg total) by mouth 3 (three) times daily as needed.  . diclofenac sodium (VOLTAREN) 1 % GEL Apply 1 application topically as needed.  . DULoxetine (CYMBALTA) 60 MG capsule Take 1 capsule (60 mg total) by mouth daily.  Marland Kitchen eletriptan (RELPAX) 40 MG tablet Take  1 tablet (40 mg total) by mouth as needed for migraine. may repeat in 2 hours if necessary  . esomeprazole (NEXIUM) 40 MG capsule TAKE 1 BY MOUTH DAILY BEFORE BREAKFAST  . fluticasone (FLONASE) 50 MCG/ACT nasal spray Place 2 sprays into both nostrils daily. 2 sprays each nostril daily  . glucose blood test strip Use as instructed (Patient taking differently: Bayer Contour. Use as instructed)  . Melatonin 3 MG CAPS Take 3 mg by mouth at bedtime as needed (for sleep).   . metFORMIN (GLUCOPHAGE) 500 MG tablet TAKE 1-2 BY MOUTH 2 TIMES DAILY WITH MEALS  . morphine (MSIR) 15 MG tablet Take 1 tablet (15 mg total) by mouth every 4 (four) hours as needed.  . nebivolol (BYSTOLIC) 10 MG tablet Take 1 tablet (10 mg total) by mouth daily.  . rosuvastatin (CRESTOR) 20 MG tablet Take 1 tablet (20 mg total) by mouth 2 (two) times a week.  . tadalafil (CIALIS) 20 MG tablet Take 20 mg by mouth daily as needed.    . tamsulosin (FLOMAX) 0.4 MG CAPS capsule Take 1 capsule (0.4 mg total) by mouth daily.  . Tapentadol HCl 75 MG TABS Take 1 tablet (75 mg total) by mouth 2 (two) times daily.  . [DISCONTINUED] ATIVAN 1 MG tablet Take 1 tablet (1 mg total) by mouth daily as needed for anxiety.  . [DISCONTINUED] DULoxetine (CYMBALTA) 60 MG capsule Take 1 capsule (60 mg total) by mouth daily.   No facility-administered encounter medications on file as of 08/05/2015.    Recent Results (from the past 2160 hour(s))  Hemoglobin A1c     Status: Abnormal   Collection Time: 06/03/15 10:40 AM  Result Value Ref Range   Hgb A1c MFr Bld 6.9 (H) 4.6 - 6.5 %    Comment: Glycemic Control Guidelines for People with Diabetes:Non Diabetic:  <6%Goal of Therapy: <7%Additional Action Suggested:  123456   Basic metabolic panel     Status: Abnormal   Collection Time: 06/30/15 12:08 PM  Result Value Ref Range   Sodium 138 135 - 145 mEq/L   Potassium 4.0 3.5 - 5.1 mEq/L   Chloride 99 96 - 112 mEq/L   CO2 30 19 - 32 mEq/L   Glucose, Bld 177  (H) 70 - 99 mg/dL   BUN 26 (H) 6 - 23 mg/dL   Creatinine, Ser 1.11 0.40 - 1.50 mg/dL   Calcium 9.3 8.4 - 10.5 mg/dL   GFR 71.81 >60.00 mL/min    Past Psychiatric History/Hospitalization(s) Patient denies any previous history of psychiatric inpatient treatment or any suicidal attempt.  He has history of anxiety and depression mostly due to his chronic back pain and neck pain.  In the past he has taken Paxil, BuSpar and Neurontin however he liked Cymbalta better.  Patient denies any history of paranoia, hallucination, mania, psychosis or any agitation. Anxiety:  Yes Bipolar Disorder: No Depression: No Mania: No Psychosis: No Schizophrenia: No Personality Disorder: No Hospitalization for psychiatric illness: No History of Electroconvulsive Shock Therapy: No Prior Suicide Attempts: No  Physical Exam: Constitutional:  BP 142/88 mmHg  Pulse 80  Ht 5\' 11"  (1.803 m)  Wt 256 lb 9.6 oz (116.393 kg)  BMI 35.80 kg/m2  Recent Results (from the past 2160 hour(s))  Hemoglobin A1c     Status: Abnormal   Collection Time: 06/03/15 10:40 AM  Result Value Ref Range   Hgb A1c MFr Bld 6.9 (H) 4.6 - 6.5 %    Comment: Glycemic Control Guidelines for People with Diabetes:Non Diabetic:  <6%Goal of Therapy: <7%Additional Action Suggested:  123456   Basic metabolic panel     Status: Abnormal   Collection Time: 06/30/15 12:08 PM  Result Value Ref Range   Sodium 138 135 - 145 mEq/L   Potassium 4.0 3.5 - 5.1 mEq/L   Chloride 99 96 - 112 mEq/L   CO2 30 19 - 32 mEq/L   Glucose, Bld 177 (H) 70 - 99 mg/dL   BUN 26 (H) 6 - 23 mg/dL   Creatinine, Ser 1.11 0.40 - 1.50 mg/dL   Calcium 9.3 8.4 - 10.5 mg/dL   GFR 71.81 >60.00 mL/min    Musculoskeletal: Strength & Muscle Tone: spastic Gait & Station: Using a cane to walk because of pain Patient leans: Front  Mental Status Examination;  Patient is casually dressed and fairly groomed.  He is Anxious but cooperative.  He maintained good eye contact.  He  described his mood euthymic and his affect is appropriate.  His speech is slow with normal tone and volume. His thought processes logical and goal-directed.  There were no flight if ideas or any loose association. He denies any active or passive suicidal thoughts or homicidal thoughts. His attention and concentration is fair. He denies any auditory or visual hallucination. His fund of knowledge is adequate. There no psychotic symptoms present. He's alert and oriented x3.  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 Anxiety   . Depression   . Gout   . DISH (diffuse idiopathic skeletal hyperostosis)     prev rheum w/u done at [facility]   . Seasonal allergies   . Hyperlipidemia   . Kidney stone 2009  . Diabetes mellitus without complication ([place])   . GERD (gastroesophageal reflux disease)   . Panic   . Diverticulitis   . 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  He has taken Ativan which helps his anxiety.  He is moving to [place] and like to get refills .  I review his records, recent blood work results and collateral information from his primary care physician.  Continue Cymbalta 60 mg daily.  Recommended to take Ativan 1-2 tablet as needed for severe anxiety .  Reassurance given.  Recommended to start looking for a new physician as soon as possible as patient is taking a lot of  medication including pain medicine and call us back once he has physician name and contact information..  I wish him a good  lack.  Discussed meditation side effects and benefits.  He has no concerns including tremors shakes or any EPS. Recommended to call us back if he has any question or any concern.  Time spent 25 minutes.  More than 50% of the time spent in psychoeducation, counseling and coordination of care.  Discuss safety plan that anytime having active suicidal thoughts or homicidal thoughts then patient need to call 911 or go to the local emergency room.  Patient will not require any follow-up appointment as he is moving to [place].  Follow-up in 6 month.  Kyriakos Babler T., MD 08/05/2015

## 2015-08-12 ENCOUNTER — Other Ambulatory Visit: Payer: Self-pay | Admitting: Family Medicine

## 2015-08-12 ENCOUNTER — Other Ambulatory Visit: Payer: Self-pay | Admitting: *Deleted

## 2015-08-12 DIAGNOSIS — E119 Type 2 diabetes mellitus without complications: Secondary | ICD-10-CM

## 2015-08-12 MED ORDER — ELETRIPTAN HYDROBROMIDE 40 MG PO TABS: 40.0000 mg | ORAL_TABLET | ORAL | Status: AC | PRN

## 2015-08-12 MED ORDER — FLUTICASONE PROPIONATE 50 MCG/ACT NA SUSP: 2.0000 | Freq: Every day | NASAL | Status: AC

## 2015-08-12 MED ORDER — CANDESARTAN CILEXETIL-HCTZ 32-25 MG PO TABS: 1.0000 | ORAL_TABLET | Freq: Every day | ORAL | Status: AC

## 2015-08-12 MED ORDER — ESOMEPRAZOLE MAGNESIUM 40 MG PO CPDR: DELAYED_RELEASE_CAPSULE | ORAL | Status: AC

## 2015-08-12 MED ORDER — TAMSULOSIN HCL 0.4 MG PO CAPS: 0.4000 mg | ORAL_CAPSULE | Freq: Every day | ORAL | Status: AC

## 2015-08-12 MED ORDER — NEBIVOLOL HCL 10 MG PO TABS: 10.0000 mg | ORAL_TABLET | Freq: Every day | ORAL | Status: AC

## 2015-08-12 MED ORDER — GLUCOSE BLOOD VI STRP: ORAL_STRIP | Status: AC

## 2015-08-12 MED ORDER — BD LANCET ULTRAFINE 33G MISC: 1.0000 | Freq: Two times a day (BID) | Status: AC

## 2015-08-12 MED ORDER — CELECOXIB 200 MG PO CAPS: ORAL_CAPSULE | ORAL | Status: DC

## 2015-08-12 MED ORDER — METFORMIN HCL 500 MG PO TABS: ORAL_TABLET | ORAL | Status: AC

## 2015-08-12 MED ORDER — CYCLOBENZAPRINE HCL 10 MG PO TABS: 10.0000 mg | ORAL_TABLET | Freq: Three times a day (TID) | ORAL | Status: AC | PRN

## 2015-08-12 MED ORDER — ROSUVASTATIN CALCIUM 20 MG PO TABS: 20.0000 mg | ORAL_TABLET | ORAL | Status: AC

## 2015-08-12 NOTE — Progress Notes (Signed)
Patient moving, needed refills done.  Given to patient, crestor sent per patient request.

## 2015-08-19 DIAGNOSIS — M47812 Spondylosis without myelopathy or radiculopathy, cervical region: Secondary | ICD-10-CM | POA: Diagnosis not present

## 2015-08-20 ENCOUNTER — Encounter: Payer: Self-pay | Admitting: Family Medicine

## 2015-08-21 ENCOUNTER — Encounter: Payer: Self-pay | Admitting: Family Medicine

## 2015-09-25 DIAGNOSIS — M545 Low back pain: Secondary | ICD-10-CM | POA: Diagnosis not present

## 2015-09-25 DIAGNOSIS — M542 Cervicalgia: Secondary | ICD-10-CM | POA: Diagnosis not present

## 2015-09-25 DIAGNOSIS — M546 Pain in thoracic spine: Secondary | ICD-10-CM | POA: Diagnosis not present

## 2015-10-09 DIAGNOSIS — M545 Low back pain: Secondary | ICD-10-CM | POA: Diagnosis not present

## 2015-10-09 DIAGNOSIS — M542 Cervicalgia: Secondary | ICD-10-CM | POA: Diagnosis not present

## 2015-10-09 DIAGNOSIS — M546 Pain in thoracic spine: Secondary | ICD-10-CM | POA: Diagnosis not present

## 2015-10-22 DIAGNOSIS — M542 Cervicalgia: Secondary | ICD-10-CM | POA: Diagnosis not present

## 2015-10-22 DIAGNOSIS — M545 Low back pain: Secondary | ICD-10-CM | POA: Diagnosis not present

## 2015-10-22 DIAGNOSIS — M546 Pain in thoracic spine: Secondary | ICD-10-CM | POA: Diagnosis not present

## 2015-10-24 ENCOUNTER — Telehealth: Payer: Self-pay

## 2015-10-24 NOTE — Telephone Encounter (Signed)
[facility] at [phone] left v/m requesting refill celebrex. Last printed # 180 x 1 on 08/12/15 and given to pt with note pt moving. I am not able to reach pt by phone to see what he did with rx. Pt should have available refill. Advised [facility] would send note to Dr Damita Dunnings to see if OK to refill at [facility] mail order ([facility] does not have 08/12/15 rx.)Please advise.

## 2015-10-24 NOTE — Telephone Encounter (Signed)
May await PCP

## 2015-10-26 MED ORDER — CELECOXIB 200 MG PO CAPS: ORAL_CAPSULE | ORAL | 0 refills | Status: AC

## 2015-10-26 NOTE — Telephone Encounter (Signed)
Sent mail order rx for 90d supply.  Will need to have future rxs via new MD, patient moved in the meantime.  Thanks.

## 2015-10-29 DIAGNOSIS — M4810 Ankylosing hyperostosis [Forestier], site unspecified: Secondary | ICD-10-CM | POA: Diagnosis not present

## 2015-10-29 DIAGNOSIS — I10 Essential (primary) hypertension: Secondary | ICD-10-CM | POA: Diagnosis not present

## 2015-10-29 DIAGNOSIS — E119 Type 2 diabetes mellitus without complications: Secondary | ICD-10-CM | POA: Diagnosis not present

## 2015-10-29 DIAGNOSIS — F410 Panic disorder [episodic paroxysmal anxiety] without agoraphobia: Secondary | ICD-10-CM | POA: Diagnosis not present

## 2015-10-29 DIAGNOSIS — R040 Epistaxis: Secondary | ICD-10-CM | POA: Diagnosis not present

## 2015-11-06 DIAGNOSIS — M545 Low back pain: Secondary | ICD-10-CM | POA: Diagnosis not present

## 2015-11-06 DIAGNOSIS — M542 Cervicalgia: Secondary | ICD-10-CM | POA: Diagnosis not present

## 2015-11-06 DIAGNOSIS — M546 Pain in thoracic spine: Secondary | ICD-10-CM | POA: Diagnosis not present

## 2015-11-20 DIAGNOSIS — M546 Pain in thoracic spine: Secondary | ICD-10-CM | POA: Diagnosis not present

## 2015-11-20 DIAGNOSIS — M545 Low back pain: Secondary | ICD-10-CM | POA: Diagnosis not present

## 2015-11-20 DIAGNOSIS — M542 Cervicalgia: Secondary | ICD-10-CM | POA: Diagnosis not present

## 2015-12-04 DIAGNOSIS — M542 Cervicalgia: Secondary | ICD-10-CM | POA: Diagnosis not present

## 2015-12-04 DIAGNOSIS — M546 Pain in thoracic spine: Secondary | ICD-10-CM | POA: Diagnosis not present

## 2015-12-04 DIAGNOSIS — M545 Low back pain: Secondary | ICD-10-CM | POA: Diagnosis not present

## 2015-12-08 ENCOUNTER — Other Ambulatory Visit: Payer: Self-pay

## 2015-12-11 ENCOUNTER — Ambulatory Visit: Payer: Self-pay

## 2015-12-11 ENCOUNTER — Encounter: Payer: Self-pay | Admitting: Family Medicine

## 2015-12-25 DIAGNOSIS — M545 Low back pain: Secondary | ICD-10-CM | POA: Diagnosis not present

## 2015-12-25 DIAGNOSIS — M542 Cervicalgia: Secondary | ICD-10-CM | POA: Diagnosis not present

## 2015-12-25 DIAGNOSIS — M546 Pain in thoracic spine: Secondary | ICD-10-CM | POA: Diagnosis not present

## 2016-01-08 DIAGNOSIS — M545 Low back pain: Secondary | ICD-10-CM | POA: Diagnosis not present

## 2016-01-08 DIAGNOSIS — M546 Pain in thoracic spine: Secondary | ICD-10-CM | POA: Diagnosis not present

## 2016-01-08 DIAGNOSIS — M542 Cervicalgia: Secondary | ICD-10-CM | POA: Diagnosis not present

## 2016-01-21 DIAGNOSIS — M47892 Other spondylosis, cervical region: Secondary | ICD-10-CM | POA: Diagnosis not present

## 2016-01-21 DIAGNOSIS — M130 Polyarthritis, unspecified: Secondary | ICD-10-CM | POA: Diagnosis not present

## 2016-01-21 DIAGNOSIS — M4810 Ankylosing hyperostosis [Forestier], site unspecified: Secondary | ICD-10-CM | POA: Diagnosis not present

## 2016-01-21 DIAGNOSIS — M1009 Idiopathic gout, multiple sites: Secondary | ICD-10-CM | POA: Diagnosis not present

## 2016-01-21 DIAGNOSIS — M5136 Other intervertebral disc degeneration, lumbar region: Secondary | ICD-10-CM | POA: Diagnosis not present

## 2016-01-21 DIAGNOSIS — M533 Sacrococcygeal disorders, not elsewhere classified: Secondary | ICD-10-CM | POA: Diagnosis not present

## 2016-01-21 DIAGNOSIS — E13618 Other specified diabetes mellitus with other diabetic arthropathy: Secondary | ICD-10-CM | POA: Diagnosis not present

## 2016-01-21 DIAGNOSIS — G43119 Migraine with aura, intractable, without status migrainosus: Secondary | ICD-10-CM | POA: Diagnosis not present

## 2016-01-22 DIAGNOSIS — M546 Pain in thoracic spine: Secondary | ICD-10-CM | POA: Diagnosis not present

## 2016-01-22 DIAGNOSIS — M542 Cervicalgia: Secondary | ICD-10-CM | POA: Diagnosis not present

## 2016-01-22 DIAGNOSIS — M545 Low back pain: Secondary | ICD-10-CM | POA: Diagnosis not present

## 2016-01-26 DIAGNOSIS — Z23 Encounter for immunization: Secondary | ICD-10-CM | POA: Diagnosis not present

## 2016-02-05 DIAGNOSIS — M546 Pain in thoracic spine: Secondary | ICD-10-CM | POA: Diagnosis not present

## 2016-02-05 DIAGNOSIS — M545 Low back pain: Secondary | ICD-10-CM | POA: Diagnosis not present

## 2016-02-05 DIAGNOSIS — M542 Cervicalgia: Secondary | ICD-10-CM | POA: Diagnosis not present

## 2016-02-24 DIAGNOSIS — Z7982 Long term (current) use of aspirin: Secondary | ICD-10-CM | POA: Diagnosis not present

## 2016-02-24 DIAGNOSIS — Z79899 Other long term (current) drug therapy: Secondary | ICD-10-CM | POA: Diagnosis not present

## 2016-02-24 DIAGNOSIS — K859 Acute pancreatitis without necrosis or infection, unspecified: Secondary | ICD-10-CM | POA: Diagnosis not present

## 2016-02-24 DIAGNOSIS — Z7984 Long term (current) use of oral hypoglycemic drugs: Secondary | ICD-10-CM | POA: Diagnosis not present

## 2016-02-24 DIAGNOSIS — R1084 Generalized abdominal pain: Secondary | ICD-10-CM | POA: Diagnosis not present

## 2016-03-02 DIAGNOSIS — R12 Heartburn: Secondary | ICD-10-CM | POA: Diagnosis not present

## 2016-03-02 DIAGNOSIS — K859 Acute pancreatitis without necrosis or infection, unspecified: Secondary | ICD-10-CM | POA: Diagnosis not present

## 2016-03-02 DIAGNOSIS — Z09 Encounter for follow-up examination after completed treatment for conditions other than malignant neoplasm: Secondary | ICD-10-CM | POA: Diagnosis not present

## 2016-03-02 DIAGNOSIS — Z760 Encounter for issue of repeat prescription: Secondary | ICD-10-CM | POA: Diagnosis not present

## 2016-03-17 DIAGNOSIS — M4810 Ankylosing hyperostosis [Forestier], site unspecified: Secondary | ICD-10-CM | POA: Diagnosis not present

## 2016-03-17 DIAGNOSIS — M150 Primary generalized (osteo)arthritis: Secondary | ICD-10-CM | POA: Diagnosis not present

## 2016-03-17 DIAGNOSIS — M47892 Other spondylosis, cervical region: Secondary | ICD-10-CM | POA: Diagnosis not present

## 2016-03-17 DIAGNOSIS — E13618 Other specified diabetes mellitus with other diabetic arthropathy: Secondary | ICD-10-CM | POA: Diagnosis not present

## 2016-03-17 DIAGNOSIS — M1009 Idiopathic gout, multiple sites: Secondary | ICD-10-CM | POA: Diagnosis not present

## 2016-03-17 DIAGNOSIS — G43119 Migraine with aura, intractable, without status migrainosus: Secondary | ICD-10-CM | POA: Diagnosis not present

## 2016-03-17 DIAGNOSIS — M5136 Other intervertebral disc degeneration, lumbar region: Secondary | ICD-10-CM | POA: Diagnosis not present

## 2016-03-18 DIAGNOSIS — M546 Pain in thoracic spine: Secondary | ICD-10-CM | POA: Diagnosis not present

## 2016-03-18 DIAGNOSIS — M545 Low back pain: Secondary | ICD-10-CM | POA: Diagnosis not present

## 2016-03-18 DIAGNOSIS — M542 Cervicalgia: Secondary | ICD-10-CM | POA: Diagnosis not present

## 2016-03-31 DIAGNOSIS — M545 Low back pain: Secondary | ICD-10-CM | POA: Diagnosis not present

## 2016-03-31 DIAGNOSIS — M546 Pain in thoracic spine: Secondary | ICD-10-CM | POA: Diagnosis not present

## 2016-03-31 DIAGNOSIS — M542 Cervicalgia: Secondary | ICD-10-CM | POA: Diagnosis not present

## 2016-04-14 DIAGNOSIS — M542 Cervicalgia: Secondary | ICD-10-CM | POA: Diagnosis not present

## 2016-04-14 DIAGNOSIS — M545 Low back pain: Secondary | ICD-10-CM | POA: Diagnosis not present

## 2016-04-14 DIAGNOSIS — M546 Pain in thoracic spine: Secondary | ICD-10-CM | POA: Diagnosis not present

## 2016-04-19 DIAGNOSIS — L814 Other melanin hyperpigmentation: Secondary | ICD-10-CM | POA: Diagnosis not present

## 2016-04-19 DIAGNOSIS — D225 Melanocytic nevi of trunk: Secondary | ICD-10-CM | POA: Diagnosis not present

## 2016-04-19 DIAGNOSIS — L821 Other seborrheic keratosis: Secondary | ICD-10-CM | POA: Diagnosis not present

## 2016-04-19 DIAGNOSIS — D1801 Hemangioma of skin and subcutaneous tissue: Secondary | ICD-10-CM | POA: Diagnosis not present

## 2016-04-19 DIAGNOSIS — D485 Neoplasm of uncertain behavior of skin: Secondary | ICD-10-CM | POA: Diagnosis not present

## 2016-04-19 DIAGNOSIS — L218 Other seborrheic dermatitis: Secondary | ICD-10-CM | POA: Diagnosis not present

## 2016-04-29 DIAGNOSIS — M545 Low back pain: Secondary | ICD-10-CM | POA: Diagnosis not present

## 2016-04-29 DIAGNOSIS — M542 Cervicalgia: Secondary | ICD-10-CM | POA: Diagnosis not present

## 2016-04-29 DIAGNOSIS — M546 Pain in thoracic spine: Secondary | ICD-10-CM | POA: Diagnosis not present

## 2016-06-10 DIAGNOSIS — M542 Cervicalgia: Secondary | ICD-10-CM | POA: Diagnosis not present

## 2016-06-10 DIAGNOSIS — M545 Low back pain: Secondary | ICD-10-CM | POA: Diagnosis not present

## 2016-06-10 DIAGNOSIS — M546 Pain in thoracic spine: Secondary | ICD-10-CM | POA: Diagnosis not present

## 2016-06-23 DIAGNOSIS — M542 Cervicalgia: Secondary | ICD-10-CM | POA: Diagnosis not present

## 2016-06-23 DIAGNOSIS — M545 Low back pain: Secondary | ICD-10-CM | POA: Diagnosis not present

## 2016-06-23 DIAGNOSIS — M546 Pain in thoracic spine: Secondary | ICD-10-CM | POA: Diagnosis not present

## 2016-07-06 DIAGNOSIS — M5416 Radiculopathy, lumbar region: Secondary | ICD-10-CM | POA: Diagnosis not present

## 2016-07-06 DIAGNOSIS — M5417 Radiculopathy, lumbosacral region: Secondary | ICD-10-CM | POA: Diagnosis not present

## 2016-07-06 DIAGNOSIS — M545 Low back pain: Secondary | ICD-10-CM | POA: Diagnosis not present

## 2016-07-08 DIAGNOSIS — M546 Pain in thoracic spine: Secondary | ICD-10-CM | POA: Diagnosis not present

## 2016-07-08 DIAGNOSIS — M545 Low back pain: Secondary | ICD-10-CM | POA: Diagnosis not present

## 2016-07-08 DIAGNOSIS — M542 Cervicalgia: Secondary | ICD-10-CM | POA: Diagnosis not present

## 2016-07-14 DIAGNOSIS — M1009 Idiopathic gout, multiple sites: Secondary | ICD-10-CM | POA: Diagnosis not present

## 2016-07-14 DIAGNOSIS — G43119 Migraine with aura, intractable, without status migrainosus: Secondary | ICD-10-CM | POA: Diagnosis not present

## 2016-07-14 DIAGNOSIS — M5136 Other intervertebral disc degeneration, lumbar region: Secondary | ICD-10-CM | POA: Diagnosis not present

## 2016-07-14 DIAGNOSIS — E13618 Other specified diabetes mellitus with other diabetic arthropathy: Secondary | ICD-10-CM | POA: Diagnosis not present

## 2016-07-14 DIAGNOSIS — M47892 Other spondylosis, cervical region: Secondary | ICD-10-CM | POA: Diagnosis not present

## 2016-07-14 DIAGNOSIS — M4810 Ankylosing hyperostosis [Forestier], site unspecified: Secondary | ICD-10-CM | POA: Diagnosis not present

## 2016-07-14 DIAGNOSIS — M150 Primary generalized (osteo)arthritis: Secondary | ICD-10-CM | POA: Diagnosis not present

## 2016-07-20 DIAGNOSIS — Z136 Encounter for screening for cardiovascular disorders: Secondary | ICD-10-CM | POA: Diagnosis not present

## 2016-07-20 DIAGNOSIS — Z800 Family history of malignant neoplasm of digestive organs: Secondary | ICD-10-CM | POA: Diagnosis not present

## 2016-07-20 DIAGNOSIS — M4810 Ankylosing hyperostosis [Forestier], site unspecified: Secondary | ICD-10-CM | POA: Diagnosis not present

## 2016-07-20 DIAGNOSIS — I10 Essential (primary) hypertension: Secondary | ICD-10-CM | POA: Diagnosis not present

## 2016-07-20 DIAGNOSIS — Z0000 Encounter for general adult medical examination without abnormal findings: Secondary | ICD-10-CM | POA: Diagnosis not present

## 2016-07-20 DIAGNOSIS — E119 Type 2 diabetes mellitus without complications: Secondary | ICD-10-CM | POA: Diagnosis not present

## 2016-08-05 DIAGNOSIS — M542 Cervicalgia: Secondary | ICD-10-CM | POA: Diagnosis not present

## 2016-08-05 DIAGNOSIS — M545 Low back pain: Secondary | ICD-10-CM | POA: Diagnosis not present

## 2016-08-05 DIAGNOSIS — M546 Pain in thoracic spine: Secondary | ICD-10-CM | POA: Diagnosis not present

## 2016-08-19 DIAGNOSIS — M546 Pain in thoracic spine: Secondary | ICD-10-CM | POA: Diagnosis not present

## 2016-08-19 DIAGNOSIS — M545 Low back pain: Secondary | ICD-10-CM | POA: Diagnosis not present

## 2016-08-19 DIAGNOSIS — M542 Cervicalgia: Secondary | ICD-10-CM | POA: Diagnosis not present

## 2016-09-02 DIAGNOSIS — M545 Low back pain: Secondary | ICD-10-CM | POA: Diagnosis not present

## 2016-09-02 DIAGNOSIS — M546 Pain in thoracic spine: Secondary | ICD-10-CM | POA: Diagnosis not present

## 2016-09-02 DIAGNOSIS — M542 Cervicalgia: Secondary | ICD-10-CM | POA: Diagnosis not present

## 2016-09-07 DIAGNOSIS — R718 Other abnormality of red blood cells: Secondary | ICD-10-CM | POA: Diagnosis not present

## 2016-09-07 DIAGNOSIS — R195 Other fecal abnormalities: Secondary | ICD-10-CM | POA: Diagnosis not present

## 2016-09-07 DIAGNOSIS — N183 Chronic kidney disease, stage 3 (moderate): Secondary | ICD-10-CM | POA: Diagnosis not present

## 2016-09-07 DIAGNOSIS — Z800 Family history of malignant neoplasm of digestive organs: Secondary | ICD-10-CM | POA: Diagnosis not present

## 2016-09-16 DIAGNOSIS — M542 Cervicalgia: Secondary | ICD-10-CM | POA: Diagnosis not present

## 2016-09-16 DIAGNOSIS — M546 Pain in thoracic spine: Secondary | ICD-10-CM | POA: Diagnosis not present

## 2016-09-16 DIAGNOSIS — M545 Low back pain: Secondary | ICD-10-CM | POA: Diagnosis not present

## 2016-09-27 DIAGNOSIS — J0101 Acute recurrent maxillary sinusitis: Secondary | ICD-10-CM | POA: Diagnosis not present

## 2016-09-27 DIAGNOSIS — G44209 Tension-type headache, unspecified, not intractable: Secondary | ICD-10-CM | POA: Diagnosis not present

## 2016-09-27 DIAGNOSIS — J3489 Other specified disorders of nose and nasal sinuses: Secondary | ICD-10-CM | POA: Diagnosis not present

## 2016-10-06 DIAGNOSIS — R208 Other disturbances of skin sensation: Secondary | ICD-10-CM | POA: Diagnosis not present

## 2016-10-06 DIAGNOSIS — L814 Other melanin hyperpigmentation: Secondary | ICD-10-CM | POA: Diagnosis not present

## 2016-10-06 DIAGNOSIS — Z09 Encounter for follow-up examination after completed treatment for conditions other than malignant neoplasm: Secondary | ICD-10-CM | POA: Diagnosis not present

## 2016-10-06 DIAGNOSIS — L821 Other seborrheic keratosis: Secondary | ICD-10-CM | POA: Diagnosis not present

## 2016-10-06 DIAGNOSIS — Z872 Personal history of diseases of the skin and subcutaneous tissue: Secondary | ICD-10-CM | POA: Diagnosis not present

## 2016-10-06 DIAGNOSIS — D225 Melanocytic nevi of trunk: Secondary | ICD-10-CM | POA: Diagnosis not present

## 2016-10-06 DIAGNOSIS — L298 Other pruritus: Secondary | ICD-10-CM | POA: Diagnosis not present

## 2016-10-06 DIAGNOSIS — D485 Neoplasm of uncertain behavior of skin: Secondary | ICD-10-CM | POA: Diagnosis not present

## 2016-10-14 DIAGNOSIS — M546 Pain in thoracic spine: Secondary | ICD-10-CM | POA: Diagnosis not present

## 2016-10-14 DIAGNOSIS — M545 Low back pain: Secondary | ICD-10-CM | POA: Diagnosis not present

## 2016-10-14 DIAGNOSIS — M542 Cervicalgia: Secondary | ICD-10-CM | POA: Diagnosis not present

## 2016-10-19 DIAGNOSIS — D125 Benign neoplasm of sigmoid colon: Secondary | ICD-10-CM | POA: Diagnosis not present

## 2016-10-19 DIAGNOSIS — K635 Polyp of colon: Secondary | ICD-10-CM | POA: Diagnosis not present

## 2016-10-19 DIAGNOSIS — E785 Hyperlipidemia, unspecified: Secondary | ICD-10-CM | POA: Diagnosis not present

## 2016-10-19 DIAGNOSIS — Z1211 Encounter for screening for malignant neoplasm of colon: Secondary | ICD-10-CM | POA: Diagnosis not present

## 2016-10-19 DIAGNOSIS — Z7984 Long term (current) use of oral hypoglycemic drugs: Secondary | ICD-10-CM | POA: Diagnosis not present

## 2016-10-19 DIAGNOSIS — D120 Benign neoplasm of cecum: Secondary | ICD-10-CM | POA: Diagnosis not present

## 2016-10-19 DIAGNOSIS — I129 Hypertensive chronic kidney disease with stage 1 through stage 4 chronic kidney disease, or unspecified chronic kidney disease: Secondary | ICD-10-CM | POA: Diagnosis not present

## 2016-10-19 DIAGNOSIS — K644 Residual hemorrhoidal skin tags: Secondary | ICD-10-CM | POA: Diagnosis not present

## 2016-10-19 DIAGNOSIS — K648 Other hemorrhoids: Secondary | ICD-10-CM | POA: Diagnosis not present

## 2016-10-19 DIAGNOSIS — E1122 Type 2 diabetes mellitus with diabetic chronic kidney disease: Secondary | ICD-10-CM | POA: Diagnosis not present

## 2016-10-19 DIAGNOSIS — Z800 Family history of malignant neoplasm of digestive organs: Secondary | ICD-10-CM | POA: Diagnosis not present

## 2016-10-19 DIAGNOSIS — K219 Gastro-esophageal reflux disease without esophagitis: Secondary | ICD-10-CM | POA: Diagnosis not present

## 2016-10-19 DIAGNOSIS — N183 Chronic kidney disease, stage 3 (moderate): Secondary | ICD-10-CM | POA: Diagnosis not present

## 2016-10-19 DIAGNOSIS — K5730 Diverticulosis of large intestine without perforation or abscess without bleeding: Secondary | ICD-10-CM | POA: Diagnosis not present

## 2016-10-19 DIAGNOSIS — R195 Other fecal abnormalities: Secondary | ICD-10-CM | POA: Diagnosis not present

## 2016-11-04 DIAGNOSIS — M542 Cervicalgia: Secondary | ICD-10-CM | POA: Diagnosis not present

## 2016-11-04 DIAGNOSIS — M545 Low back pain: Secondary | ICD-10-CM | POA: Diagnosis not present

## 2016-11-04 DIAGNOSIS — M546 Pain in thoracic spine: Secondary | ICD-10-CM | POA: Diagnosis not present

## 2016-11-18 DIAGNOSIS — M546 Pain in thoracic spine: Secondary | ICD-10-CM | POA: Diagnosis not present

## 2016-11-18 DIAGNOSIS — M542 Cervicalgia: Secondary | ICD-10-CM | POA: Diagnosis not present

## 2016-11-18 DIAGNOSIS — M545 Low back pain: Secondary | ICD-10-CM | POA: Diagnosis not present

## 2016-12-02 DIAGNOSIS — M546 Pain in thoracic spine: Secondary | ICD-10-CM | POA: Diagnosis not present

## 2016-12-02 DIAGNOSIS — M545 Low back pain: Secondary | ICD-10-CM | POA: Diagnosis not present

## 2016-12-02 DIAGNOSIS — M542 Cervicalgia: Secondary | ICD-10-CM | POA: Diagnosis not present

## 2016-12-23 DIAGNOSIS — M545 Low back pain: Secondary | ICD-10-CM | POA: Diagnosis not present

## 2016-12-23 DIAGNOSIS — M546 Pain in thoracic spine: Secondary | ICD-10-CM | POA: Diagnosis not present

## 2016-12-23 DIAGNOSIS — M542 Cervicalgia: Secondary | ICD-10-CM | POA: Diagnosis not present

## 2017-01-06 DIAGNOSIS — M542 Cervicalgia: Secondary | ICD-10-CM | POA: Diagnosis not present

## 2017-01-06 DIAGNOSIS — M546 Pain in thoracic spine: Secondary | ICD-10-CM | POA: Diagnosis not present

## 2017-01-06 DIAGNOSIS — M545 Low back pain: Secondary | ICD-10-CM | POA: Diagnosis not present

## 2017-01-12 DIAGNOSIS — M4810 Ankylosing hyperostosis [Forestier], site unspecified: Secondary | ICD-10-CM | POA: Diagnosis not present

## 2017-01-12 DIAGNOSIS — E13618 Other specified diabetes mellitus with other diabetic arthropathy: Secondary | ICD-10-CM | POA: Diagnosis not present

## 2017-01-12 DIAGNOSIS — M5136 Other intervertebral disc degeneration, lumbar region: Secondary | ICD-10-CM | POA: Diagnosis not present

## 2017-01-12 DIAGNOSIS — M150 Primary generalized (osteo)arthritis: Secondary | ICD-10-CM | POA: Diagnosis not present

## 2017-01-12 DIAGNOSIS — M1009 Idiopathic gout, multiple sites: Secondary | ICD-10-CM | POA: Diagnosis not present

## 2017-01-12 DIAGNOSIS — M47892 Other spondylosis, cervical region: Secondary | ICD-10-CM | POA: Diagnosis not present

## 2017-01-12 DIAGNOSIS — G43119 Migraine with aura, intractable, without status migrainosus: Secondary | ICD-10-CM | POA: Diagnosis not present

## 2017-01-21 DIAGNOSIS — Z23 Encounter for immunization: Secondary | ICD-10-CM | POA: Diagnosis not present

## 2017-01-26 DIAGNOSIS — M545 Low back pain: Secondary | ICD-10-CM | POA: Diagnosis not present

## 2017-01-26 DIAGNOSIS — M546 Pain in thoracic spine: Secondary | ICD-10-CM | POA: Diagnosis not present

## 2017-01-26 DIAGNOSIS — M542 Cervicalgia: Secondary | ICD-10-CM | POA: Diagnosis not present

## 2017-02-10 DIAGNOSIS — M545 Low back pain: Secondary | ICD-10-CM | POA: Diagnosis not present

## 2017-02-10 DIAGNOSIS — M546 Pain in thoracic spine: Secondary | ICD-10-CM | POA: Diagnosis not present

## 2017-02-10 DIAGNOSIS — M542 Cervicalgia: Secondary | ICD-10-CM | POA: Diagnosis not present

## 2017-03-02 DIAGNOSIS — M542 Cervicalgia: Secondary | ICD-10-CM | POA: Diagnosis not present

## 2017-03-02 DIAGNOSIS — M546 Pain in thoracic spine: Secondary | ICD-10-CM | POA: Diagnosis not present

## 2017-03-02 DIAGNOSIS — M545 Low back pain: Secondary | ICD-10-CM | POA: Diagnosis not present

## 2017-03-16 DIAGNOSIS — M542 Cervicalgia: Secondary | ICD-10-CM | POA: Diagnosis not present

## 2017-03-16 DIAGNOSIS — M545 Low back pain: Secondary | ICD-10-CM | POA: Diagnosis not present

## 2017-03-16 DIAGNOSIS — M546 Pain in thoracic spine: Secondary | ICD-10-CM | POA: Diagnosis not present
